# Patient Record
Sex: Male | Born: 1975 | Race: White | Hispanic: No | Smoking: Current every day smoker
Health system: Southern US, Community
[De-identification: ages and names within clinical notes are randomized; demographics above are authoritative.]

## PROBLEM LIST (undated history)

## (undated) DIAGNOSIS — M51369 Other intervertebral disc degeneration, lumbar region without mention of lumbar back pain or lower extremity pain: Secondary | ICD-10-CM

## (undated) DIAGNOSIS — M5136 Other intervertebral disc degeneration, lumbar region: Secondary | ICD-10-CM

## (undated) DIAGNOSIS — G8929 Other chronic pain: Secondary | ICD-10-CM

## (undated) DIAGNOSIS — M5430 Sciatica, unspecified side: Secondary | ICD-10-CM

## (undated) DIAGNOSIS — B1920 Unspecified viral hepatitis C without hepatic coma: Secondary | ICD-10-CM

## (undated) DIAGNOSIS — M549 Dorsalgia, unspecified: Secondary | ICD-10-CM

## (undated) HISTORY — DX: Unspecified viral hepatitis C without hepatic coma: B19.20

## (undated) HISTORY — PX: KNEE SURGERY: SHX244

---

## 2002-08-21 ENCOUNTER — Emergency Department (HOSPITAL_COMMUNITY): Admission: EM | Admit: 2002-08-21 | Discharge: 2002-08-21 | Payer: Self-pay | Admitting: Emergency Medicine

## 2008-02-12 ENCOUNTER — Inpatient Hospital Stay (HOSPITAL_COMMUNITY): Admission: AD | Admit: 2008-02-12 | Discharge: 2008-02-16 | Payer: Self-pay | Admitting: Psychiatry

## 2008-02-12 ENCOUNTER — Ambulatory Visit: Payer: Self-pay | Admitting: Psychiatry

## 2008-02-12 ENCOUNTER — Emergency Department (HOSPITAL_COMMUNITY): Admission: EM | Admit: 2008-02-12 | Discharge: 2008-02-12 | Payer: Self-pay | Admitting: Emergency Medicine

## 2009-10-12 ENCOUNTER — Emergency Department (HOSPITAL_COMMUNITY): Admission: EM | Admit: 2009-10-12 | Discharge: 2009-10-14 | Payer: Self-pay | Admitting: Emergency Medicine

## 2010-11-10 ENCOUNTER — Emergency Department (HOSPITAL_COMMUNITY): Payer: Self-pay

## 2010-11-10 ENCOUNTER — Emergency Department (HOSPITAL_COMMUNITY)
Admission: EM | Admit: 2010-11-10 | Discharge: 2010-11-10 | Disposition: A | Discharge status: Home / Self Care | Payer: Self-pay | Attending: Emergency Medicine | Admitting: Emergency Medicine

## 2010-11-10 DIAGNOSIS — M25473 Effusion, unspecified ankle: Secondary | ICD-10-CM | POA: Insufficient documentation

## 2010-11-10 DIAGNOSIS — X58XXXA Exposure to other specified factors, initial encounter: Secondary | ICD-10-CM | POA: Insufficient documentation

## 2010-11-10 DIAGNOSIS — Y929 Unspecified place or not applicable: Secondary | ICD-10-CM | POA: Insufficient documentation

## 2010-11-10 DIAGNOSIS — M25476 Effusion, unspecified foot: Secondary | ICD-10-CM | POA: Insufficient documentation

## 2010-11-10 DIAGNOSIS — M25579 Pain in unspecified ankle and joints of unspecified foot: Secondary | ICD-10-CM | POA: Insufficient documentation

## 2010-11-10 DIAGNOSIS — S82899A Other fracture of unspecified lower leg, initial encounter for closed fracture: Secondary | ICD-10-CM | POA: Insufficient documentation

## 2010-11-13 LAB — BASIC METABOLIC PANEL
BUN: 7 mg/dL (ref 6–23)
Chloride: 111 mEq/L (ref 96–112)
GFR calc non Af Amer: >60 mL/min (ref >60)
Glucose, Bld: 133 mg/dL — ABNORMAL HIGH (ref 70–99)
Potassium: 3.5 mEq/L (ref 3.5–5.1)
Sodium: 143 mEq/L (ref 135–145)

## 2010-11-13 LAB — RAPID URINE DRUG SCREEN, HOSP PERFORMED
Barbiturates: NOT DETECTED
Benzodiazepines: POSITIVE — AB
Cocaine: POSITIVE — AB
Opiates: NOT DETECTED

## 2010-11-13 LAB — DIFFERENTIAL
Basophils Absolute: 0 10*3/uL (ref 0.0–0.1)
Eosinophils Absolute: 0.1 10*3/uL (ref 0.0–0.7)
Eosinophils Relative: 2 % (ref 0–5)
Lymphocytes Relative: 33 % (ref 12–46)
Lymphs Abs: 2 10*3/uL (ref 0.7–4.0)
Monocytes Absolute: 0.7 10*3/uL (ref 0.1–1.0)

## 2010-11-13 LAB — CBC
HCT: 43.4 % (ref 39.0–52.0)
Hemoglobin: 15 g/dL (ref 13.0–17.0)
MCV: 103.6 fL — ABNORMAL HIGH (ref 78.0–100.0)
RDW: 13.4 % (ref 11.5–15.5)

## 2010-11-13 LAB — URINALYSIS, ROUTINE W REFLEX MICROSCOPIC
Glucose, UA: NEGATIVE mg/dL
Ketones, ur: NEGATIVE mg/dL
Nitrite: NEGATIVE
Specific Gravity, Urine: 1.02 (ref 1.005–1.030)
pH: 5 (ref 5.0–8.0)

## 2010-11-13 LAB — ACETAMINOPHEN LEVEL: Acetaminophen (Tylenol), Serum: 28.8 ug/mL (ref 10–30)

## 2010-11-16 ENCOUNTER — Emergency Department (HOSPITAL_COMMUNITY)
Admission: EM | Admit: 2010-11-16 | Discharge: 2010-11-16 | Disposition: A | Discharge status: Home / Self Care | Payer: Self-pay | Attending: Emergency Medicine | Admitting: Emergency Medicine

## 2010-11-16 ENCOUNTER — Emergency Department (HOSPITAL_COMMUNITY): Payer: Self-pay

## 2010-11-16 DIAGNOSIS — Z09 Encounter for follow-up examination after completed treatment for conditions other than malignant neoplasm: Secondary | ICD-10-CM | POA: Insufficient documentation

## 2010-11-16 DIAGNOSIS — M79609 Pain in unspecified limb: Secondary | ICD-10-CM | POA: Insufficient documentation

## 2010-11-16 DIAGNOSIS — S82409A Unspecified fracture of shaft of unspecified fibula, initial encounter for closed fracture: Secondary | ICD-10-CM | POA: Insufficient documentation

## 2010-11-16 DIAGNOSIS — W19XXXS Unspecified fall, sequela: Secondary | ICD-10-CM | POA: Insufficient documentation

## 2010-11-25 ENCOUNTER — Ambulatory Visit (INDEPENDENT_AMBULATORY_CARE_PROVIDER_SITE_OTHER): Payer: Self-pay | Admitting: Orthopedic Surgery

## 2010-11-25 ENCOUNTER — Encounter: Payer: Self-pay | Admitting: Orthopedic Surgery

## 2010-11-25 VITALS — Ht 64.0 in | Wt 155.0 lb

## 2010-11-25 DIAGNOSIS — S8262XA Displaced fracture of lateral malleolus of left fibula, initial encounter for closed fracture: Secondary | ICD-10-CM

## 2010-11-25 DIAGNOSIS — S8263XA Displaced fracture of lateral malleolus of unspecified fibula, initial encounter for closed fracture: Secondary | ICD-10-CM

## 2010-11-25 MED ORDER — HYDROCODONE-ACETAMINOPHEN 7.5-325 MG PO TABS: 1.0000 | ORAL_TABLET | ORAL | Status: AC | PRN

## 2010-11-25 NOTE — Discharge Summary (Signed)
A separate identifiable. X-ray report, LEFT ankle 3 views. There is a lateral malleolar fracture, minimally displaced with an intact mortise.  Previous x-rays reviewed from the hospital 2 sets  No major change in fracture position.

## 2010-11-25 NOTE — Progress Notes (Addendum)
Chief complaint pain, LEFT ankle.  Date of injury March 17  35 year old male complains of sharp throbbing pain, which is 10 out of 10 and is constant over the LEFT ankle. He has been to the ER on 2 occasions. The 1st time on the 17th and went back to the emergency room on the 21st times had x-rays which show what appears to be a minimally displaced lateral malleolus fracture. He was treated with a posterior splint crutches, and oxycodone 5 mg.  We will repeat his x-ray today to see what the fracture looks like now and make recommendations at that time.  Review of systems he reports unexpected weight loss of nausea and anxiety. Denies blurred vision chest pain, shortness of breath, frequency, skin changes, numbness, easy bleeding and excessive thirst or adverse reaction.  History reviewed. No pertinent family history. History reviewed. No pertinent past medical history. History reviewed. No pertinent past surgical history. Social history patient works in [employer] representative, and maintenance currently unemployed. Does report a history of smoking, and no drug use. No alcohol use. I agree completely with great 12.   General: The patient is normally developed, with normal grooming and hygiene. There are no gross deformities. The body habitus is normal   CDV: The pulse and perfusion of the extremities are normal   LYMPH: There is no gross lymphadenopathy in the extremities   Skin: There are no rashes, ulcers or cafe-au-lait spot   Psyche: The patient is alert, awake and oriented.  Mood is normal   Neuro:  The coordination and balance are normal.  Sensation is normal. Reflexes are 2+ and equal   Musculoskeletal  Left ankle: swelling, lateral malleolar tenderness  Skin ecchymotic  Ankle position is neutral Muscle tone is normal   Short leg nonweightbearing cast applied.

## 2010-12-13 ENCOUNTER — Other Ambulatory Visit: Payer: Self-pay | Admitting: *Deleted

## 2010-12-13 DIAGNOSIS — M25579 Pain in unspecified ankle and joints of unspecified foot: Secondary | ICD-10-CM

## 2010-12-13 MED ORDER — HYDROCODONE-ACETAMINOPHEN 7.5-325 MG PO TABS: 1.0000 | ORAL_TABLET | ORAL | Status: DC | PRN

## 2010-12-26 ENCOUNTER — Other Ambulatory Visit: Payer: Self-pay | Admitting: *Deleted

## 2010-12-26 DIAGNOSIS — M25579 Pain in unspecified ankle and joints of unspecified foot: Secondary | ICD-10-CM

## 2010-12-26 MED ORDER — HYDROCODONE-ACETAMINOPHEN 7.5-325 MG PO TABS: 1.0000 | ORAL_TABLET | ORAL | Status: DC | PRN

## 2011-01-07 ENCOUNTER — Ambulatory Visit: Payer: Self-pay | Admitting: Orthopedic Surgery
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
## 2011-01-10 NOTE — Discharge Summary (Signed)
[name], Jay Keller NO.:  [id number]   MEDICAL RECORD NO.:  [id number]          PATIENT TYPE:  IPS   LOCATION:  0406                          FACILITY:  [facility]   PHYSICIAN:  Anselm Jungling, MD  DATE OF BIRTH:  17-Sep-1975   DATE OF ADMISSION:  02/12/2008  DATE OF DISCHARGE:  02/16/2008                               DISCHARGE SUMMARY   IDENTIFYING DATA AND REASON FOR REFERRAL:  The patient is a 35 year old  married white male admitted for treatment of polysubstance abuse and  depression.  He admitted to drinking heavily and using marijuana heavily  prior to admission.  He had stressors consisting of legal difficulties  and child custody problems.  Please refer to the admission note for  further details pertaining to the symptoms, circumstances and history  that led to his hospitalization.  He was given an initial Axis I  diagnosis of alcohol dependence and polysubstance dependence and  substance-induced mood disorder.   MEDICAL AND LABORATORY:  The patient was medically and physically  assessed by the psychiatric nurse practitioner.  He was in good health  without any active or chronic medical problems.   HOSPITAL COURSE:  The patient was admitted to the adult inpatient  psychiatric service.  He presented as a well-nourished, normally-  developed male who initially spent most of his hospital stay in bed.  He  appeared very tired and depressed.  He was mildly tremulous.  He was  placed on a Librium withdrawal protocol.  There were no signs or  symptoms of psychosis or delirium.   The patient chose to spend most of his hospital stay in bed.  He did  express interest towards the end of his stay in the possibility of a  long-term residential rehabilitation facility, but this did not appear  to be feasible given the patient's funding situation.  He indicated that  he felt ready for discharge on the fifth hospital day.  He indicated  that he would attend 12-step  groups in the community.   He was treated with Seroquel 100 mg every night to stabilize sleep and  Celexa 10 mg daily to address mood-related depressive symptoms.   AFTERCARE:  The patient was to follow-up at [facility] on  February 23, 2008.  He was to follow up with Patty Deal in a follow-up  appointment.   DISCHARGE MEDICATIONS:  1. Seroquel 100 mg every night.  2. Celexa 10 mg daily.   DISCHARGE DIAGNOSES:  AXIS I:  Alcohol dependence, early remission;  polysubstance dependence, early remission;  substance-induced mood  disorder.  AXIS II:  Deferred.  AXIS III:  No acute or chronic illnesses.  AXIS IV:  Stressors severe.  AXIS V: GAF on discharge 50.      Anselm Jungling, MD  Electronically Signed     SPB/MEDQ  D:  02/22/2008  T:  02/22/2008  Job:  [id number]

## 2011-01-16 ENCOUNTER — Ambulatory Visit (INDEPENDENT_AMBULATORY_CARE_PROVIDER_SITE_OTHER): Payer: Self-pay | Admitting: Orthopedic Surgery

## 2011-01-16 DIAGNOSIS — M25579 Pain in unspecified ankle and joints of unspecified foot: Secondary | ICD-10-CM

## 2011-01-16 DIAGNOSIS — S82899A Other fracture of unspecified lower leg, initial encounter for closed fracture: Secondary | ICD-10-CM

## 2011-01-16 MED ORDER — HYDROCODONE-ACETAMINOPHEN 5-325 MG PO TABS: 1.0000 | ORAL_TABLET | ORAL | Status: DC | PRN

## 2011-01-16 NOTE — Progress Notes (Signed)
Fracture care.  Followup.  Lateral malleolus fracture, treated with cast, nonweightbearing.  X-rays today.  Complains of lateral pain.  X-rays show spiral Weber B. Type fibular fracture, nondisplaced.  Removed cast still tenderness at fracture.  Aircast  Weight-bear as tolerated. X-rays 6 weeks.  Medicine changed to hydrocodone 5 mg

## 2011-01-16 NOTE — Patient Instructions (Signed)
Wear with reg shoes   Weight beara s tolerated   Wean off the crutches   X-rays in 6 weeks

## 2011-02-13 ENCOUNTER — Other Ambulatory Visit: Payer: Self-pay | Admitting: *Deleted

## 2011-02-13 DIAGNOSIS — M25579 Pain in unspecified ankle and joints of unspecified foot: Secondary | ICD-10-CM

## 2011-02-13 MED ORDER — HYDROCODONE-ACETAMINOPHEN 5-325 MG PO TABS: 1.0000 | ORAL_TABLET | ORAL | Status: DC | PRN

## 2011-03-05 ENCOUNTER — Ambulatory Visit: Payer: Self-pay | Admitting: Orthopedic Surgery

## 2011-03-06 ENCOUNTER — Other Ambulatory Visit: Payer: Self-pay | Admitting: *Deleted

## 2011-03-06 DIAGNOSIS — M25579 Pain in unspecified ankle and joints of unspecified foot: Secondary | ICD-10-CM

## 2011-03-06 MED ORDER — HYDROCODONE-ACETAMINOPHEN 5-325 MG PO TABS: 1.0000 | ORAL_TABLET | ORAL | Status: DC | PRN

## 2011-03-12 ENCOUNTER — Ambulatory Visit (INDEPENDENT_AMBULATORY_CARE_PROVIDER_SITE_OTHER): Payer: Self-pay | Admitting: Orthopedic Surgery

## 2011-03-12 ENCOUNTER — Encounter: Payer: Self-pay | Admitting: Orthopedic Surgery

## 2011-03-12 DIAGNOSIS — S8263XA Displaced fracture of lateral malleolus of unspecified fibula, initial encounter for closed fracture: Secondary | ICD-10-CM

## 2011-03-12 DIAGNOSIS — M25579 Pain in unspecified ankle and joints of unspecified foot: Secondary | ICD-10-CM

## 2011-03-12 MED ORDER — HYDROCODONE-ACETAMINOPHEN 5-325 MG PO TABS: 1.0000 | ORAL_TABLET | ORAL | Status: DC | PRN

## 2011-03-12 NOTE — Progress Notes (Signed)
  Status post closed LEFT ankle fracture lateral malleolus treated with cast  It's been approximately 4 months he comes in complaining of lateral pain initial treatment was with cast followed by brace is currently in an Aircast  He seems to have activity related pain lateral malleolus  Review of systems negative for catching locking giving way  Exam GENERAL: normal development   CDV: pulses are normal   Skin: normal  Lymph: deferred  Psychiatric: awake, alert and oriented  Neuro: normal sensation  MSK  He has normal range of motion LEFT ankle with tenderness over the lateral malleolus.  He does have some pain with rotational stress.  The x-ray shows that the fracture is fibrous union alignment normal mortise intact  Recommend to treatment options to him one was surgical with plate fixation vs. Continued nonoperative treatment and brace wear medication for pain  I advised him to try to wait this out this should eventually heal on its own.  He agreed.  Follow up in 2 months take hydrocodone for pain  Continue brace

## 2011-03-12 NOTE — Progress Notes (Signed)
Separately identifiable x-ray report 3 views LEFT ankle There is a lateral malleolus fracture spiral, with what appears to be fibrous union.  Compared to previous film shows stable configuration.  Mortise intact.  Impression stable lateral malleolus fracture with fibrous union

## 2011-04-08 ENCOUNTER — Other Ambulatory Visit: Payer: Self-pay | Admitting: *Deleted

## 2011-04-08 DIAGNOSIS — M25579 Pain in unspecified ankle and joints of unspecified foot: Secondary | ICD-10-CM

## 2011-04-08 MED ORDER — HYDROCODONE-ACETAMINOPHEN 5-325 MG PO TABS: 1.0000 | ORAL_TABLET | ORAL | Status: DC | PRN

## 2011-05-14 ENCOUNTER — Ambulatory Visit (INDEPENDENT_AMBULATORY_CARE_PROVIDER_SITE_OTHER): Payer: Self-pay | Admitting: Orthopedic Surgery

## 2011-05-14 ENCOUNTER — Ambulatory Visit (HOSPITAL_COMMUNITY)
Admission: RE | Admit: 2011-05-14 | Discharge: 2011-05-14 | Disposition: A | Discharge status: Home / Self Care | Payer: Self-pay | Source: Ambulatory Visit | Attending: Orthopedic Surgery | Admitting: Orthopedic Surgery

## 2011-05-14 ENCOUNTER — Other Ambulatory Visit: Payer: Self-pay | Admitting: Orthopedic Surgery

## 2011-05-14 ENCOUNTER — Encounter: Payer: Self-pay | Admitting: Orthopedic Surgery

## 2011-05-14 VITALS — Ht 64.0 in | Wt 155.0 lb

## 2011-05-14 DIAGNOSIS — M25579 Pain in unspecified ankle and joints of unspecified foot: Secondary | ICD-10-CM

## 2011-05-14 DIAGNOSIS — Z4789 Encounter for other orthopedic aftercare: Secondary | ICD-10-CM | POA: Insufficient documentation

## 2011-05-14 DIAGNOSIS — S8262XA Displaced fracture of lateral malleolus of left fibula, initial encounter for closed fracture: Secondary | ICD-10-CM

## 2011-05-14 MED ORDER — HYDROCODONE-ACETAMINOPHEN 5-325 MG PO TABS: 1.0000 | ORAL_TABLET | ORAL | Status: DC | PRN

## 2011-05-14 NOTE — Patient Instructions (Signed)
Wear air cast to work

## 2011-05-14 NOTE — Progress Notes (Signed)
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

## 2011-05-22 LAB — CBC
HCT: 48.4
Hemoglobin: 16.8
MCHC: 34.8
MCV: 102.6 — ABNORMAL HIGH
RBC: 4.72
WBC: 6.4

## 2011-05-22 LAB — BASIC METABOLIC PANEL
CO2: 27
Chloride: 97
GFR calc Af Amer: >60
Potassium: 4

## 2011-05-22 LAB — RAPID URINE DRUG SCREEN, HOSP PERFORMED
Cocaine: NOT DETECTED
Opiates: NOT DETECTED

## 2011-05-22 LAB — DIFFERENTIAL
Eosinophils Absolute: 0
Eosinophils Relative: 0
Lymphs Abs: 0.7
Monocytes Absolute: 1
Monocytes Relative: 16 — ABNORMAL HIGH

## 2011-05-22 LAB — HEPATIC FUNCTION PANEL
ALT: 277 — ABNORMAL HIGH
AST: 196 — ABNORMAL HIGH
Alkaline Phosphatase: 107
Indirect Bilirubin: 0.9
Total Protein: 7.4

## 2011-05-22 LAB — ETHANOL: Alcohol, Ethyl (B): <5

## 2011-07-14 ENCOUNTER — Other Ambulatory Visit: Payer: Self-pay | Admitting: *Deleted

## 2011-07-14 DIAGNOSIS — M25579 Pain in unspecified ankle and joints of unspecified foot: Secondary | ICD-10-CM

## 2011-07-14 MED ORDER — HYDROCODONE-ACETAMINOPHEN 5-325 MG PO TABS: 1.0000 | ORAL_TABLET | ORAL | Status: DC | PRN

## 2011-08-13 ENCOUNTER — Ambulatory Visit: Payer: Self-pay | Admitting: Orthopedic Surgery

## 2011-08-13 ENCOUNTER — Encounter: Payer: Self-pay | Admitting: Orthopedic Surgery

## 2012-04-02 ENCOUNTER — Emergency Department (HOSPITAL_COMMUNITY)
Admission: EM | Admit: 2012-04-02 | Discharge: 2012-04-02 | Disposition: A | Discharge status: Home / Self Care | Payer: Self-pay | Attending: Emergency Medicine | Admitting: Emergency Medicine

## 2012-04-02 ENCOUNTER — Encounter (HOSPITAL_COMMUNITY): Payer: Self-pay | Admitting: *Deleted

## 2012-04-02 DIAGNOSIS — L255 Unspecified contact dermatitis due to plants, except food: Secondary | ICD-10-CM | POA: Insufficient documentation

## 2012-04-02 DIAGNOSIS — F17200 Nicotine dependence, unspecified, uncomplicated: Secondary | ICD-10-CM | POA: Insufficient documentation

## 2012-04-02 MED ORDER — PREDNISONE 10 MG PO TABS
60.0000 mg | ORAL_TABLET | Freq: Once | ORAL | Status: AC
  Administered 2012-04-02: 60 mg via ORAL
  Filled 2012-04-02: qty 6

## 2012-04-02 MED ORDER — PREDNISONE 50 MG PO TABS: ORAL_TABLET | ORAL | Status: AC

## 2012-04-02 MED ORDER — FAMOTIDINE 20 MG PO TABS
20.0000 mg | ORAL_TABLET | Freq: Once | ORAL | Status: AC
  Administered 2012-04-02: 20 mg via ORAL
  Filled 2012-04-02: qty 1

## 2012-04-02 MED ORDER — DIPHENHYDRAMINE HCL 25 MG PO CAPS
50.0000 mg | ORAL_CAPSULE | Freq: Four times a day (QID) | ORAL | Status: DC | PRN
  Administered 2012-04-02: 50 mg via ORAL
  Filled 2012-04-02: qty 2

## 2012-04-02 NOTE — ED Provider Notes (Signed)
History     CSN: [id number]  Arrival date & time 04/02/12  1113   First MD Initiated Contact with Patient 04/02/12 1151      Chief Complaint  Patient presents with  . Rash    (Consider location/radiation/quality/duration/timing/severity/associated sxs/prior treatment) HPI Comments: states he was weed-eating on Monday and "got into some poison ivy".  Itching all over.  Patient is a 36 y.o. male presenting with rash. The history is provided by the patient. No language interpreter was used.  Rash  This is a new problem. Episode onset: 4 days ago. The problem has been gradually worsening. The problem is associated with plant contact. There has been no fever. The pain is moderate. Associated symptoms include blisters, itching and weeping. He has tried nothing for the symptoms.    History reviewed. No pertinent past medical history.  History reviewed. No pertinent past surgical history.  History reviewed. No pertinent family history.  History  Substance Use Topics  . Smoking status: Current Everyday Smoker  . Smokeless tobacco: Not on file  . Alcohol Use: Yes     rarely      Review of Systems  Constitutional: Negative for fever and chills.  Skin: Positive for itching and rash.  All other systems reviewed and are negative.    Allergies  Review of patient's allergies indicates no known allergies.  Home Medications   Current Outpatient Rx  Name Route Sig Dispense Refill  . HYDROCODONE-ACETAMINOPHEN 5-325 MG PO TABS Oral Take 1 tablet by mouth every 4 (four) hours as needed for pain. 60 tablet 0  . IBUPROFEN 200 MG PO TABS Oral Take 400 mg by mouth every 6 (six) hours as needed.      Marland Kitchen PREDNISONE 50 MG PO TABS  One tab po QD 6 tablet 0    BP 134/82  Pulse 94  Temp 98.9 F (37.2 C) (Oral)  Resp 18  Ht 5\' 3"  (1.6 m)  Wt 145 lb (65.772 kg)  BMI 25.69 kg/m2  SpO2 98%  Physical Exam  Nursing note and vitals reviewed. Constitutional: He is oriented to person,  place, and time. He appears well-developed and well-nourished.  HENT:  Head: Normocephalic and atraumatic.  Eyes: EOM are normal.  Neck: Normal range of motion.  Cardiovascular: Normal rate, regular rhythm, normal heart sounds and intact distal pulses.   Pulmonary/Chest: Effort normal and breath sounds normal. No respiratory distress.  Abdominal: Soft. He exhibits no distension. There is no tenderness.  Musculoskeletal: Normal range of motion.  Neurological: He is alert and oriented to person, place, and time.  Skin: Skin is warm and dry. Rash noted.       Widely scattered redness, itching and blisters to face, neck,torso arms, groin and thighs.  Psychiatric: He has a normal mood and affect. Judgment normal.    ED Course  Procedures (including critical care time)  Labs Reviewed - No data to display No results found.   1. Rhus dermatitis       MDM  rx-prednisone 50 mg, 6 Benadryl 50 mg QID pepcid 20 mg BID F/u with PCP prn        Evalina Field, PA 04/05/12 1532

## 2012-04-02 NOTE — ED Notes (Signed)
Pt with rash since weed eating on Monday, possible poison oak

## 2012-04-02 NOTE — ED Notes (Signed)
R. Miller, PA at bedside  

## 2012-04-02 NOTE — ED Notes (Signed)
Itching red rash, onset Monday after weed -eating.

## 2012-04-03 NOTE — ED Provider Notes (Signed)
History     CSN: [id number]  Arrival date & time 04/02/12  1113   First MD Initiated Contact with Patient 04/02/12 1151      Chief Complaint  Patient presents with  . Rash    (Consider location/radiation/quality/duration/timing/severity/associated sxs/prior treatment) HPI  History reviewed. No pertinent past medical history.  History reviewed. No pertinent past surgical history.  History reviewed. No pertinent family history.  History  Substance Use Topics  . Smoking status: Current Everyday Smoker  . Smokeless tobacco: Not on file  . Alcohol Use: Yes     rarely      Review of Systems  Allergies  Review of patient's allergies indicates no known allergies.  Home Medications   Current Outpatient Rx  Name Route Sig Dispense Refill  . HYDROCODONE-ACETAMINOPHEN 5-325 MG PO TABS Oral Take 1 tablet by mouth every 4 (four) hours as needed for pain. 60 tablet 0  . IBUPROFEN 200 MG PO TABS Oral Take 400 mg by mouth every 6 (six) hours as needed.      Marland Kitchen PREDNISONE 50 MG PO TABS  One tab po QD 6 tablet 0    BP 134/82  Pulse 94  Temp 98.9 F (37.2 C) (Oral)  Resp 18  Ht 5\' 3"  (1.6 m)  Wt 145 lb (65.772 kg)  BMI 25.69 kg/m2  SpO2 98%  Physical Exam  ED Course  Procedures (including critical care time)  Labs Reviewed - No data to display No results found.   1. Rhus dermatitis       MDM  Medical screening examination/treatment/procedure(s) were performed by non-physician practitioner and as supervising physician I was immediately available for consultation/collaboration.        Donnetta Hutching, MD 04/03/12 [phone]

## 2012-04-12 NOTE — ED Provider Notes (Signed)
Medical screening examination/treatment/procedure(s) were performed by non-physician practitioner and as supervising physician I was immediately available for consultation/collaboration.  Karuna Balducci, MD 04/12/12 1944 

## 2013-01-17 ENCOUNTER — Encounter (HOSPITAL_COMMUNITY): Payer: Self-pay

## 2013-01-17 ENCOUNTER — Emergency Department (HOSPITAL_COMMUNITY): Payer: Self-pay

## 2013-01-17 ENCOUNTER — Emergency Department (HOSPITAL_COMMUNITY)
Admission: EM | Admit: 2013-01-17 | Discharge: 2013-01-17 | Disposition: A | Discharge status: Home / Self Care | Payer: Self-pay | Attending: Emergency Medicine | Admitting: Emergency Medicine

## 2013-01-17 DIAGNOSIS — S335XXA Sprain of ligaments of lumbar spine, initial encounter: Secondary | ICD-10-CM | POA: Insufficient documentation

## 2013-01-17 DIAGNOSIS — T148XXA Other injury of unspecified body region, initial encounter: Secondary | ICD-10-CM

## 2013-01-17 DIAGNOSIS — M545 Low back pain: Secondary | ICD-10-CM

## 2013-01-17 DIAGNOSIS — F17200 Nicotine dependence, unspecified, uncomplicated: Secondary | ICD-10-CM | POA: Insufficient documentation

## 2013-01-17 DIAGNOSIS — Y9289 Other specified places as the place of occurrence of the external cause: Secondary | ICD-10-CM | POA: Insufficient documentation

## 2013-01-17 DIAGNOSIS — Y9319 Activity, other involving water and watercraft: Secondary | ICD-10-CM | POA: Insufficient documentation

## 2013-01-17 DIAGNOSIS — W1692XA Jumping or diving into unspecified water causing other injury, initial encounter: Secondary | ICD-10-CM | POA: Insufficient documentation

## 2013-01-17 HISTORY — DX: Other intervertebral disc degeneration, lumbar region without mention of lumbar back pain or lower extremity pain: M51.369

## 2013-01-17 HISTORY — DX: Other intervertebral disc degeneration, lumbar region: M51.36

## 2013-01-17 MED ORDER — HYDROMORPHONE HCL PF 1 MG/ML IJ SOLN
1.0000 mg | Freq: Once | INTRAMUSCULAR | Status: AC
  Administered 2013-01-17: 1 mg via INTRAVENOUS
  Filled 2013-01-17: qty 1

## 2013-01-17 MED ORDER — FENTANYL CITRATE 0.05 MG/ML IJ SOLN
50.0000 ug | Freq: Once | INTRAMUSCULAR | Status: AC
  Administered 2013-01-17: 50 ug via INTRAVENOUS

## 2013-01-17 MED ORDER — DIAZEPAM 5 MG PO TABS
5.0000 mg | ORAL_TABLET | Freq: Once | ORAL | Status: AC
  Administered 2013-01-17: 5 mg via ORAL
  Filled 2013-01-17: qty 1

## 2013-01-17 MED ORDER — FENTANYL CITRATE 0.05 MG/ML IJ SOLN
INTRAMUSCULAR | Status: AC
  Administered 2013-01-17: 50 ug via INTRAVENOUS
  Filled 2013-01-17: qty 2

## 2013-01-17 MED ORDER — KETOROLAC TROMETHAMINE 30 MG/ML IJ SOLN
30.0000 mg | Freq: Once | INTRAMUSCULAR | Status: AC
  Administered 2013-01-17: 30 mg via INTRAVENOUS
  Filled 2013-01-17: qty 1

## 2013-01-17 MED ORDER — OXYCODONE-ACETAMINOPHEN 5-325 MG PO TABS: ORAL_TABLET | ORAL | Status: DC

## 2013-01-17 MED ORDER — HYDROMORPHONE HCL PF 1 MG/ML IJ SOLN
1.0000 mg | Freq: Once | INTRAMUSCULAR | Status: AC
  Administered 2013-01-17: 1 mg via INTRAVENOUS

## 2013-01-17 MED ORDER — DIAZEPAM 5 MG PO TABS: 5.0000 mg | ORAL_TABLET | Freq: Two times a day (BID) | ORAL | Status: DC | PRN

## 2013-01-17 MED ORDER — HYDROMORPHONE HCL PF 1 MG/ML IJ SOLN
1.0000 mg | Freq: Once | INTRAMUSCULAR | Status: DC
  Filled 2013-01-17: qty 1

## 2013-01-17 MED ORDER — NAPROXEN 250 MG PO TABS: 250.0000 mg | ORAL_TABLET | Freq: Two times a day (BID) | ORAL | Status: DC

## 2013-01-17 NOTE — ED Notes (Signed)
Left in c/o girlfriend for transport home; instructions, prescriptions and f/u information given/reviewed - verbalizes understanding.
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
## 2013-01-17 NOTE — ED Notes (Signed)
Pt reports injuring his back 2 days ago after jumping off the "[place]" [place] the [place].  Pt reports increased pain over the past two days but became unbearable today.  Pt reports pain with breathing and difficulty walking.  Pt had to be assisted out of his vehicle.

## 2015-08-26 DIAGNOSIS — B182 Chronic viral hepatitis C: Secondary | ICD-10-CM | POA: Insufficient documentation

## 2015-12-10 ENCOUNTER — Emergency Department (HOSPITAL_COMMUNITY)
Admission: EM | Admit: 2015-12-10 | Discharge: 2015-12-11 | Disposition: A | Discharge status: Other Acute Inpatient Hospital | Payer: Self-pay | Attending: Emergency Medicine | Admitting: Emergency Medicine

## 2015-12-10 ENCOUNTER — Encounter (HOSPITAL_COMMUNITY): Payer: Self-pay | Admitting: Emergency Medicine

## 2015-12-10 DIAGNOSIS — F17200 Nicotine dependence, unspecified, uncomplicated: Secondary | ICD-10-CM | POA: Insufficient documentation

## 2015-12-10 DIAGNOSIS — F1910 Other psychoactive substance abuse, uncomplicated: Secondary | ICD-10-CM

## 2015-12-10 DIAGNOSIS — F1020 Alcohol dependence, uncomplicated: Secondary | ICD-10-CM

## 2015-12-10 DIAGNOSIS — F10921 Alcohol use, unspecified with intoxication delirium: Secondary | ICD-10-CM | POA: Insufficient documentation

## 2015-12-10 LAB — CBC WITH DIFFERENTIAL/PLATELET
BASOS ABS: 0.1 10*3/uL (ref 0.0–0.1)
BASOS PCT: 1 %
EOS ABS: 0 10*3/uL (ref 0.0–0.7)
EOS PCT: 0 %
HCT: 47 % (ref 39.0–52.0)
Hemoglobin: 16.3 g/dL (ref 13.0–17.0)
Lymphocytes Relative: 30 %
Lymphs Abs: 2.7 10*3/uL (ref 0.7–4.0)
MCH: 34.6 pg — ABNORMAL HIGH (ref 26.0–34.0)
MCHC: 34.7 g/dL (ref 30.0–36.0)
MCV: 99.8 fL (ref 78.0–100.0)
MONO ABS: 0.6 10*3/uL (ref 0.1–1.0)
Monocytes Relative: 7 %
Neutro Abs: 5.5 10*3/uL (ref 1.7–7.7)
Neutrophils Relative %: 62 %
PLATELETS: 412 10*3/uL — AB (ref 150–400)
RBC: 4.71 MIL/uL (ref 4.22–5.81)
RDW: 14.3 % (ref 11.5–15.5)
WBC: 8.9 10*3/uL (ref 4.0–10.5)

## 2015-12-10 LAB — COMPREHENSIVE METABOLIC PANEL
ALBUMIN: 4.9 g/dL (ref 3.5–5.0)
ALT: 171 U/L — AB (ref 17–63)
AST: 203 U/L — AB (ref 15–41)
Alkaline Phosphatase: 133 U/L — ABNORMAL HIGH (ref 38–126)
Anion gap: 21 — ABNORMAL HIGH (ref 5–15)
BUN: 15 mg/dL (ref 6–20)
CHLORIDE: 99 mmol/L — AB (ref 101–111)
CO2: 20 mmol/L — AB (ref 22–32)
CREATININE: 0.98 mg/dL (ref 0.61–1.24)
Calcium: 8.3 mg/dL — ABNORMAL LOW (ref 8.9–10.3)
GFR calc Af Amer: >60 mL/min (ref >60)
Glucose, Bld: 74 mg/dL (ref 65–99)
POTASSIUM: 3.9 mmol/L (ref 3.5–5.1)
SODIUM: 140 mmol/L (ref 135–145)
Total Bilirubin: 1.1 mg/dL (ref 0.3–1.2)
Total Protein: 8.3 g/dL — ABNORMAL HIGH (ref 6.5–8.1)

## 2015-12-10 LAB — RAPID URINE DRUG SCREEN, HOSP PERFORMED
AMPHETAMINES: POSITIVE — AB
BARBITURATES: NOT DETECTED
BENZODIAZEPINES: NOT DETECTED
Cocaine: NOT DETECTED
Opiates: NOT DETECTED
Tetrahydrocannabinol: POSITIVE — AB

## 2015-12-10 LAB — ETHANOL: ALCOHOL ETHYL (B): 460 mg/dL — AB (ref <5)

## 2015-12-10 NOTE — ED Notes (Signed)
Pt given water at this time 

## 2015-12-10 NOTE — ED Provider Notes (Signed)
CSN: [id number]     Arrival date & time 12/10/15  1835 History   First MD Initiated Contact with Patient 12/10/15 1855     Chief Complaint  Patient presents with  . V70.1     Level V caveat due to intoxication/psychiatric disorder. The history is provided by the patient.   patient was brought in under involuntary commitment. Patient is not able to provide history and history is been obtained from police and IVC paperwork. Reportedly has been drinking heavily. May be hallucinating. Is reportedly attempting to drink himself to death. Reportedly has been saying things against my nor disease. Reportedly has destroyed the hotel room but he has been living in. Patient will not provide history to me.  Past Medical History  Diagnosis Date  . DDD (degenerative disc disease), lumbar    History reviewed. No pertinent past surgical history. History reviewed. No pertinent family history. Social History  Substance Use Topics  . Smoking status: Current Every Day Smoker  . Smokeless tobacco: None  . Alcohol Use: Yes     Comment: rarely    Review of Systems  Unable to perform ROS: Psychiatric disorder      Allergies  Review of patient's allergies indicates no known allergies.  Home Medications   Prior to Admission medications   Medication Sig Start Date End Date Taking? Authorizing Provider  diazepam (VALIUM) 5 MG tablet Take 1 tablet (5 mg total) by mouth 2 (two) times daily as needed (muscle spasm). 01/17/13   Samuel Jester, DO  ibuprofen (ADVIL,MOTRIN) 200 MG tablet Take 800 mg by mouth every 6 (six) hours as needed for pain.     Historical Provider, MD  naproxen (NAPROSYN) 250 MG tablet Take 1 tablet (250 mg total) by mouth 2 (two) times daily with a meal. 01/17/13   Samuel Jester, DO  oxyCODONE-acetaminophen (PERCOCET/ROXICET) 5-325 MG per tablet 1 or 2 tabs PO q6h prn pain 01/17/13   Samuel Jester, DO   BP 149/95 mmHg  Pulse 110  Temp(Src) 98.9 F (37.2 C) (Oral)  Resp 20   Ht  (1.6 m)  Wt 160 lb (72.576 kg)  BMI 28.35 kg/m2  SpO2 95% Physical Exam  Constitutional: He appears well-developed.  Patient is mildly agitated and will not allow me to get close enough to listen to his heart or lungs.  HENT:  Head: Atraumatic.  Eyes:  Pupils appear reactive.  Neck: No JVD present.  Pulmonary/Chest: No respiratory distress.  Musculoskeletal:  Patient appears in moving all extremities and right foot is handcuffed to the bed.  Neurological: He is alert.  Psychiatric:  Patient appears somewhat agitated    ED Course  Procedures (including critical care time) Labs Review Labs Reviewed  URINE RAPID DRUG SCREEN, HOSP PERFORMED - Abnormal; Notable for the following:    Amphetamines POSITIVE (*)    Tetrahydrocannabinol POSITIVE (*)    All other components within normal limits  ETHANOL - Abnormal; Notable for the following:    Alcohol, Ethyl (B) 460 (*)    All other components within normal limits  CBC WITH DIFFERENTIAL/PLATELET - Abnormal; Notable for the following:    MCH 34.6 (*)    Platelets 412 (*)    All other components within normal limits  COMPREHENSIVE METABOLIC PANEL - Abnormal; Notable for the following:    Chloride 99 (*)    CO2 20 (*)    Calcium 8.3 (*)    Total Protein 8.3 (*)    AST 203 (*)  ALT 171 (*)    Alkaline Phosphatase 133 (*)    Anion gap 21 (*)    All other components within normal limits    Imaging Review No results found. I have personally reviewed and evaluated these images and lab results as part of my medical decision-making.   EKG Interpretation None      MDM   Final diagnoses:  Alcohol intoxication, with delirium (HCC)  Substance abuse    Patient brought in under involuntary commitment. Somewhat belligerent. Do not allow close examination of him. Patient has mildly elevated transaminases likely from the alcohol. Alcohol level is 490 and will need monitoring after this decreases. Will likely need CIWA  protocol to help stop severe withdrawal. TTS evaluation after more sobriety.    Benjiman CoreNathan Chantia Amalfitano, MD 12/10/15 2130

## 2015-12-10 NOTE — ED Notes (Signed)
IVC papers taken out by mother and aunt.  Pt has been drinking about one gallon of liquor per day.  Says pt is trying to drink himself to death.  Has drink very much in the last 7-8 weeks.  Possibly hearing voices. Pt is not sleeping at night, has been up for the past 5 days.  He wakes up drinking and pt is not eating.  He is cursing at people that are not there.  Has been living in a hotel and tearing up the room.  Pt is a danger to self and to others.  History of ETOH abuse.

## 2015-12-10 NOTE — ED Notes (Signed)
CRITICAL VALUE ALERT  Critical value received:  etoh 460  Date of notification:  12/10/15  Time of notification:  2025  Critical value read back:Yes.    Nurse who received alert:  Bronson CurbKristy Jaylei Fuerte, RN  MD notified (1st page):  Dr Rubin PayorPickering  Time of first page:  2025  MD notified (2nd page):  Time of second page:  Responding MD:  Dr Rubin PayorPickering  Time MD responded:  2025

## 2015-12-11 ENCOUNTER — Encounter (HOSPITAL_COMMUNITY): Payer: Self-pay

## 2015-12-11 ENCOUNTER — Observation Stay (HOSPITAL_COMMUNITY)
Admission: AD | Admit: 2015-12-11 | Discharge: 2015-12-12 | Disposition: A | Discharge status: Home / Self Care | Payer: Self-pay | Source: Intra-hospital | Attending: Psychiatry | Admitting: Psychiatry

## 2015-12-11 DIAGNOSIS — F1020 Alcohol dependence, uncomplicated: Principal | ICD-10-CM | POA: Diagnosis present

## 2015-12-11 DIAGNOSIS — F411 Generalized anxiety disorder: Secondary | ICD-10-CM | POA: Diagnosis present

## 2015-12-11 DIAGNOSIS — F17200 Nicotine dependence, unspecified, uncomplicated: Secondary | ICD-10-CM | POA: Insufficient documentation

## 2015-12-11 DIAGNOSIS — M5136 Other intervertebral disc degeneration, lumbar region: Secondary | ICD-10-CM | POA: Insufficient documentation

## 2015-12-11 DIAGNOSIS — Y908 Blood alcohol level of 240 mg/100 ml or more: Secondary | ICD-10-CM | POA: Insufficient documentation

## 2015-12-11 LAB — BASIC METABOLIC PANEL
ANION GAP: 15 (ref 5–15)
BUN: 11 mg/dL (ref 6–20)
CO2: 27 mmol/L (ref 22–32)
Calcium: 8.1 mg/dL — ABNORMAL LOW (ref 8.9–10.3)
Chloride: 96 mmol/L — ABNORMAL LOW (ref 101–111)
Creatinine, Ser: 0.7 mg/dL (ref 0.61–1.24)
Glucose, Bld: 139 mg/dL — ABNORMAL HIGH (ref 65–99)
POTASSIUM: 3.5 mmol/L (ref 3.5–5.1)
SODIUM: 138 mmol/L (ref 135–145)

## 2015-12-11 LAB — ETHANOL: ALCOHOL ETHYL (B): 20 mg/dL — AB (ref <5)

## 2015-12-11 MED ORDER — VITAMIN B-1 100 MG PO TABS
100.0000 mg | ORAL_TABLET | Freq: Every day | ORAL | Status: DC
  Administered 2015-12-12: 100 mg via ORAL
  Filled 2015-12-11: qty 1

## 2015-12-11 MED ORDER — ADULT MULTIVITAMIN W/MINERALS CH
1.0000 | ORAL_TABLET | Freq: Every day | ORAL | Status: DC
  Administered 2015-12-12: 1 via ORAL
  Filled 2015-12-11: qty 1

## 2015-12-11 MED ORDER — ACETAMINOPHEN 325 MG PO TABS
650.0000 mg | ORAL_TABLET | ORAL | Status: DC | PRN
  Administered 2015-12-11: 650 mg via ORAL
  Filled 2015-12-11: qty 2

## 2015-12-11 MED ORDER — LORAZEPAM 1 MG PO TABS
0.0000 mg | ORAL_TABLET | Freq: Four times a day (QID) | ORAL | Status: DC
  Administered 2015-12-11 (×2): 1 mg via ORAL
  Filled 2015-12-11 (×2): qty 1

## 2015-12-11 MED ORDER — VITAMIN B-1 100 MG PO TABS
100.0000 mg | ORAL_TABLET | Freq: Every day | ORAL | Status: DC
  Administered 2015-12-11: 100 mg via ORAL
  Filled 2015-12-11: qty 1

## 2015-12-11 MED ORDER — HYDROXYZINE HCL 25 MG PO TABS
25.0000 mg | ORAL_TABLET | Freq: Four times a day (QID) | ORAL | Status: DC | PRN
  Administered 2015-12-11 – 2015-12-12 (×2): 25 mg via ORAL
  Filled 2015-12-11 (×2): qty 1

## 2015-12-11 MED ORDER — ONDANSETRON HCL 4 MG PO TABS
4.0000 mg | ORAL_TABLET | Freq: Three times a day (TID) | ORAL | Status: DC | PRN
  Administered 2015-12-11 (×2): 4 mg via ORAL
  Filled 2015-12-11 (×2): qty 1

## 2015-12-11 MED ORDER — MAGNESIUM HYDROXIDE 400 MG/5ML PO SUSP: 30.0000 mL | Freq: Every day | ORAL | Status: DC | PRN

## 2015-12-11 MED ORDER — ONDANSETRON 4 MG PO TBDP
4.0000 mg | ORAL_TABLET | Freq: Four times a day (QID) | ORAL | Status: DC | PRN
  Administered 2015-12-11 – 2015-12-12 (×2): 4 mg via ORAL
  Filled 2015-12-11 (×2): qty 1

## 2015-12-11 MED ORDER — THIAMINE HCL 100 MG/ML IJ SOLN: 100.0000 mg | Freq: Every day | INTRAMUSCULAR | Status: DC

## 2015-12-11 MED ORDER — ALUM & MAG HYDROXIDE-SIMETH 200-200-20 MG/5ML PO SUSP
30.0000 mL | ORAL | Status: DC | PRN
  Administered 2015-12-11: 30 mL via ORAL
  Filled 2015-12-11: qty 30

## 2015-12-11 MED ORDER — LORAZEPAM 1 MG PO TABS: 1.0000 mg | ORAL_TABLET | Freq: Every day | ORAL | Status: DC

## 2015-12-11 MED ORDER — LORAZEPAM 1 MG PO TABS
1.0000 mg | ORAL_TABLET | Freq: Three times a day (TID) | ORAL | Status: DC
  Administered 2015-12-12 (×2): 1 mg via ORAL
  Filled 2015-12-11 (×2): qty 1

## 2015-12-11 MED ORDER — NICOTINE 21 MG/24HR TD PT24: 21.0000 mg | MEDICATED_PATCH | Freq: Every day | TRANSDERMAL | Status: DC

## 2015-12-11 MED ORDER — LOPERAMIDE HCL 2 MG PO CAPS: 2.0000 mg | ORAL_CAPSULE | ORAL | Status: DC | PRN

## 2015-12-11 MED ORDER — TRAZODONE HCL 100 MG PO TABS
100.0000 mg | ORAL_TABLET | Freq: Every evening | ORAL | Status: DC | PRN
  Administered 2015-12-12: 100 mg via ORAL
  Filled 2015-12-11: qty 1

## 2015-12-11 MED ORDER — LORAZEPAM 1 MG PO TABS: 0.0000 mg | ORAL_TABLET | Freq: Two times a day (BID) | ORAL | Status: DC

## 2015-12-11 MED ORDER — LORAZEPAM 1 MG PO TABS
1.0000 mg | ORAL_TABLET | Freq: Four times a day (QID) | ORAL | Status: DC | PRN
  Administered 2015-12-12 (×2): 1 mg via ORAL
  Filled 2015-12-11 (×2): qty 1

## 2015-12-11 MED ORDER — ACETAMINOPHEN 325 MG PO TABS: 650.0000 mg | ORAL_TABLET | Freq: Four times a day (QID) | ORAL | Status: DC | PRN

## 2015-12-11 MED ORDER — NICOTINE 21 MG/24HR TD PT24
21.0000 mg | MEDICATED_PATCH | Freq: Every day | TRANSDERMAL | Status: DC
  Administered 2015-12-11 – 2015-12-12 (×2): 21 mg via TRANSDERMAL
  Filled 2015-12-11 (×2): qty 1

## 2015-12-11 MED ORDER — ZOLPIDEM TARTRATE 5 MG PO TABS
10.0000 mg | ORAL_TABLET | Freq: Every evening | ORAL | Status: DC | PRN
Start: 2015-12-11 — End: 2015-12-11

## 2015-12-11 MED ORDER — ALUM & MAG HYDROXIDE-SIMETH 200-200-20 MG/5ML PO SUSP
30.0000 mL | ORAL | Status: DC | PRN
Start: 2015-12-11 — End: 2015-12-11

## 2015-12-11 MED ORDER — IBUPROFEN 400 MG PO TABS: 600.0000 mg | ORAL_TABLET | Freq: Three times a day (TID) | ORAL | Status: DC | PRN

## 2015-12-11 MED ORDER — LORAZEPAM 1 MG PO TABS: 1.0000 mg | ORAL_TABLET | Freq: Two times a day (BID) | ORAL | Status: DC

## 2015-12-11 MED ORDER — ONDANSETRON 4 MG PO TBDP
4.0000 mg | ORAL_TABLET | Freq: Once | ORAL | Status: AC
  Administered 2015-12-11: 4 mg via ORAL
  Filled 2015-12-11: qty 1

## 2015-12-11 MED ORDER — LORAZEPAM 1 MG PO TABS
1.0000 mg | ORAL_TABLET | Freq: Four times a day (QID) | ORAL | Status: AC
  Administered 2015-12-11 (×2): 1 mg via ORAL
  Filled 2015-12-11 (×2): qty 1

## 2015-12-11 NOTE — ED Provider Notes (Signed)
CIWA protocol ordered, nurses report he is starting to have withdrawal and his score is a 6.   Devoria AlbeIva Chidiebere Wynn, MD 12/11/15 [phone]

## 2015-12-11 NOTE — Progress Notes (Signed)
Jay PiggMichaei Keller Dx: ETOH use disorder, severe, dependence.  Pt awake watching TV from bed.  Pt states he is unable to eat d/t upset stomach and anxiety.  Pt has visible tremors, sweating and sts he aches all over with some mild headache. Pt denies SI, HI and AVH.  Pt given ginger ale and prn med for upset stomach.   Pt agrees to verbally contract for safety.  Pt resting quietly.  Pt sts some relief of upset stomach,.  Pt continuously observed on unit for safety except when in bathroom.

## 2015-12-11 NOTE — ED Notes (Signed)
TTS completed. 

## 2015-12-11 NOTE — ED Notes (Signed)
Pt ambulated with sitter to restroom at this time with steady and even gait. No further requests at this time.

## 2015-12-11 NOTE — Progress Notes (Signed)
Nursing Admission Note  Patient is Voluntary Admission with Diagnosis of Alcohol DiO Severe. Patient admits long history of drinking half a half gallon of vodka daily and also using cannabis, cocaine, and used herion in the past but states he quit this cold [place] on his own last August. Patient and girlfriend broke up recently and his grandmother also died recently. Patient states his goals for this admission ar wanting to "help me get sober", stating he wants to stop drinking and doing all drugs. Patient also stating he would like to learn new coping skills and start working again. He lost his job several months ago due to his excessive drinking at subsequently lost his apartment which is why he currently has no home of his own. Patient denies any SI/HI/AVH and contracts for safety on the Unit.

## 2015-12-11 NOTE — BH Assessment (Addendum)
Tele Assessment Note   Jay DialsMichael Keller is an 40 y.o. male.  -Clinician reviewed note by Jay Keller.  Patient is on IVC placed by mother & aunt.  Pt would not talk to Jay Keller.  IVC papers say that patient is "drinking himself to death."  Patient talking to people not present and reportedly trashed his hotel room today.  Patient had a BAL of 460 at 19:23.  Patient is calm and cooperative during interview.  He understands that he is on IVC.  Patient denies any SI, HI or A/V hallucinations.  Pt says "I know I drink too much."  Pt admits to smoking marijuana.  When asked about why he had amphetamine in his system he said that someone gave him a ritalin.  He said "I was drunk and I took it."  Patient denies regular use of amphetamines.  Patient has been drinking for years.  He says that he has been drinking at the current rate for over 2 months.  He does report not drinking for 8 days but started back two weeks ago.  Patient had court on 04/17 for DUI but was not able to go because of lack of transportation.  Pt has been drinking about half a gallon of liquor a day.  He drank about a 5th over the last 24 hours.  Patient has no current provider.  He says over 10 [facility]  For [facility].  [facility]    -Clinician reviewed patient care with Jay SievertSpencer Simon, PA.  Jay Keller recommends a AM psych eval to uphold or rescind the IVC.  Jay Keller was contacted about disposition.  Diagnosis: ETOH use d/o severe  Past Medical History:  Past Medical History  Diagnosis Date  . DDD (degenerative disc disease), lumbar     History reviewed. No pertinent past surgical history.  Family History: History reviewed. No pertinent family history.  Social History:  reports that he has been smoking.  He does not have any smokeless tobacco history on file. He reports that he drinks alcohol. He reports that he does not use illicit drugs.  Additional Social  History:  Alcohol / Drug Use Pain Medications: none Prescriptions: None Over the Counter: pain reliever on a prn basis History of alcohol / drug use?: Yes Longest period of sobriety (when/how long): For one year he was sober (incarceration) about 5-6 years ago. Withdrawal Symptoms: Sweats, Tremors, Patient aware of relationship between substance abuse and physical/medical complications, Nausea / Vomiting, Diarrhea, Weakness, Seizures Onset of Seizures: ETOH withdrawal seizures Date of most recent seizure: "It has been a few years." Substance #1 Name of Substance 1: ETOH (liquor) 1 - Age of First Use: 40 years of age 38 - Amount (size/oz): "Probably close to half a gallon." 1 - Frequency: Daily use 1 - Duration: Two months (had not drank for 8 days the week before this one) 1 - Last Use / Amount: 04/17 Drank a 5th Substance #2 Name of Substance 2: Marijuana  2 - Age of First Use: 40 years of age 46 - Amount (size/oz): Varies 2 - Frequency: Varies 2 - Duration: on-going 2 - Last Use / Amount: 04/17  CIWA: CIWA-Ar BP: 149/95 mmHg Pulse Rate: 110 COWS:    PATIENT STRENGTHS: (choose at least two) Ability for insight Average or above average intelligence Capable of independent living Communication skills Supportive family/friends  Allergies: No Known Allergies  Home Medications:  (Not in a hospital admission)  OB/GYN Status:  No LMP for male patient.  General Assessment Data Location of Assessment: [facility] TTS Assessment: In system Is this a Tele or Face-to-Face Assessment?: Tele Assessment Is this an Initial Assessment or a Re-assessment for this encounter?: Initial Assessment Marital status: Single Is patient pregnant?: No Pregnancy Status: No Living Arrangements: Alone (Pt lives in a hotel.) Can pt return to current living arrangement?: Yes Admission Status: Involuntary Is patient capable of signing voluntary admission?: No Referral Source:  Self/Family/Friend Insurance type: SP     Crisis Care Plan Living Arrangements: Alone (Pt lives in a hotel.) Name of Psychiatrist: None Name of Therapist: None  Education Status Is patient currently in school?: No Highest grade of school patient has completed: Associate's degree  Risk to self with the past 6 months Suicidal Ideation: No Has patient been a risk to self within the past 6 months prior to admission? : No Suicidal Intent: No Has patient had any suicidal intent within the past 6 months prior to admission? : No Is patient at risk for suicide?: No Suicidal Plan?: No Has patient had any suicidal plan within the past 6 months prior to admission? : No Access to Means: No What has been your use of drugs/alcohol within the last 12 months?: THC & ETOH Previous Attempts/Gestures: No How many times?: 0 Other Self Harm Risks: Drinking copiously Triggers for Past Attempts: None known Intentional Self Injurious Behavior: None Family Suicide History: No Recent stressful life event(s): Other (Comment) (Pt says "I'm just drinking because I drink.") Persecutory voices/beliefs?: No Depression: No Depression Symptoms:  (Pt denies depressive symptoms.) Substance abuse history and/or treatment for substance abuse?: Yes Suicide prevention information given to non-admitted patients: Not applicable  Risk to Others within the past 6 months Homicidal Ideation: No Does patient have any lifetime risk of violence toward others beyond the six months prior to admission? : No Thoughts of Harm to Others: No Current Homicidal Intent: No Current Homicidal Plan: No Access to Homicidal Means: No Identified Victim: No one History of harm to others?: No Assessment of Violence: None Noted Does patient have access to weapons?: No Criminal Charges Pending?: Yes Describe Pending Criminal Charges: DUI Does patient have a court date: Yes Court Date: 12/10/15 Is patient on probation?:  No  Psychosis Hallucinations: None noted Delusions: None noted  Mental Status Report Appearance/Hygiene: Disheveled, In scrubs Eye Contact: Good Motor Activity: Freedom of movement, Unremarkable Speech: Logical/coherent Level of Consciousness: Alert Mood: Apprehensive Affect: Depressed Anxiety Level: None Thought Processes: Coherent, Relevant Judgement: Impaired Orientation: Person, Place, Situation, Time Obsessive Compulsive Thoughts/Behaviors: None  Cognitive Functioning Concentration: Decreased Memory: Recent Intact, Remote Intact IQ: Average Insight: Fair Impulse Control: Poor Appetite: Good Weight Loss: 0 Weight Gain: 0 Sleep: No Change Total Hours of Sleep: 7 Vegetative Symptoms: None  ADLScreening [facility] Assessment Services) Patient's cognitive ability adequate to safely complete daily activities?: Yes Patient able to express need for assistance with ADLs?: Yes Independently performs ADLs?: Yes (appropriate for developmental age)  Prior Inpatient Therapy Prior Inpatient Therapy: Yes Prior Therapy Dates: Years ago Prior Therapy Facilty/Provider(s): [facility] 3-4 times Reason for Treatment: SA  Prior Outpatient Therapy Prior Outpatient Therapy: Yes Prior Therapy Dates: "Years ago" Prior Therapy Facilty/Provider(s): [facility] in [place] Reason for Treatment: SA Does patient have an ACCT team?: No Does patient have Intensive In-House Services?  : No Does patient have [facility] services? : No Does patient have P4CC services?: No  ADL Screening (condition at time of admission) Patient's cognitive ability adequate  to safely complete daily activities?: Yes Is the patient deaf or have difficulty hearing?: No Does the patient have difficulty seeing, even when wearing glasses/contacts?: No Does the patient have difficulty concentrating, remembering, or making decisions?: No Patient able to express need for assistance with ADLs?: Yes Does the patient have  difficulty dressing or bathing?: No Independently performs ADLs?: Yes (appropriate for developmental age) Does the patient have difficulty walking or climbing stairs?: No Weakness of Legs: None Weakness of Arms/Hands: None       Abuse/Neglect Assessment (Assessment to be complete while patient is alone) Physical Abuse: Denies Verbal Abuse: Denies Sexual Abuse: Denies Exploitation of patient/patient's resources: Denies Self-Neglect: Denies     Merchant navy officer (For Healthcare) Does patient have an advance directive?: No Would patient like information on creating an advanced directive?: No - patient declined information    Additional Information 1:1 In Past 12 Months?: No CIRT Risk: No Elopement Risk: No Does patient have medical clearance?: Yes     Disposition:  Disposition Initial Assessment Completed for this Encounter: Yes Disposition of Patient: Other dispositions Other disposition(s): Other (Comment) (Pt to be reviewed with PA)  Jay Keller 12/11/2015 2:49 AM

## 2015-12-11 NOTE — ED Provider Notes (Signed)
Jay Keller, TSS, has evaluate patient. He states the patient now is denying any complaints of having suicidal ideation. However he is under IVC papers. They recommend a psychiatrist evaluate him in the morning.  Devoria AlbeIva Chavonne Sforza, MD, Concha PyoFACEP   Lovena Kluck, MD 12/11/15 [phone]

## 2015-12-11 NOTE — ED Provider Notes (Signed)
Seen by psych They would like to admit to OBS for ETOH detox Pt now denies SI IVC rescinded He is otherwise stable at this time They recommend repeat ETOH and call back at [phone] Vernona Rieger(Laura)  Zadie Rhineonald Tonio Seider, MD 12/11/15 1152

## 2015-12-11 NOTE — Progress Notes (Signed)
[facility] attending MD Dr. Lucianne MussKumar, report # [phone]. Can arrive anytime.   Ilean SkillMeghan Alanson Hausmann, MSW, LCSW Clinical Social Work, Disposition  12/11/2015 [phone]

## 2015-12-11 NOTE — ED Provider Notes (Signed)
The patient appears reasonably stabilized for transfer considering the current resources, flow, and capabilities available in the ED at this time, and I doubt any other [facility] requiring further screening and/or treatment in the ED prior to transfer.   Zadie Rhineonald Langdon Crosson, MD 12/11/15 1311

## 2015-12-11 NOTE — Consult Note (Signed)
Telepsych Consultation   Reason for Consult:  Alcohol abuse, Suicidal ideations Referring Physician: Forestine Na EDP Patient Identification: Jay Keller MRN:  [id number] Principal Diagnosis: Alcohol use disorder, severe, dependence ([place]) Diagnosis:   Patient Active Problem List   Diagnosis Date Noted  . Alcohol use disorder, severe, dependence ([place]) [F10.20] 12/11/2015  . Ankle fracture, lateral malleolus, closed [S82.63XA] 03/12/2011  . Ankle fracture [S82.899A] 01/16/2011    Total Time spent with patient: 30 minutes  Subjective:   Jay Keller is a 40 y.o. male patient admitted under IVC due to concerns from his family that patient has been abusing alcohol with intent to end his life. Patient states "I'm not suicidal. I have been drinking a gallon or a fifth of vodka for the last few months. I started drinking at age 61. I'm not sure why I drink. Maybe it's to deal with relationship problems. I am starting to feel bad. My last drink was sometime yesterday. I'm not sure I want rehab. I have been twenty times. I understand that it's starting to affect my health in a bad way."   HPI:    Jay Keller is a 40 year old male who was placed under IVC by his mother and aunt. The patient has been living in a hotel drinking large amounts of liquor on a daily basis. On admission his alcohol level was 460. The patient has denied any suicidal/homicidal ideations, depressive symptoms, or psychosis. During the interview patient does not seem concerned about his alcohol abuse problems. Patient continues to deny that he is suicidal. Patient informed of the potential organ damage that may occur as a result of his sustained alcohol abuse pattern. Jay Keller was informed that his current liver enzymes are elevated which indicates damage to his liver. He does not appear highly motivated to enter a residential treatment center at this time but is willing to be safety complete detox, then explore his  options for substance abuse treatment. Discussed with EDP plan to rescind IVC in order to transfer the patient to the Observation unit for continued treatment. Patient reports that he is starting to have withdrawal symptoms to include tremors and sweats. His urine drug screen is positive for marijuana and amphetamines. Pt denies any regular use of amphetamines reported he "borrowed a ritalin from a friend when he was drunk." The patient appears to have very poor insight into his symptoms.   Past Psychiatric History: Alcohol Dependence  Risk to Self: Suicidal Ideation: No Suicidal Intent: No Is patient at risk for suicide?: No Suicidal Plan?: No Access to Means: No What has been your use of drugs/alcohol within the last 12 months?: THC & ETOH How many times?: 0 Other Self Harm Risks: Drinking copiously Triggers for Past Attempts: None known Intentional Self Injurious Behavior: None Risk to Others: Homicidal Ideation: No Thoughts of Harm to Others: No Current Homicidal Intent: No Current Homicidal Plan: No Access to Homicidal Means: No Identified Victim: No one History of harm to others?: No Assessment of Violence: None Noted Does patient have access to weapons?: No Criminal Charges Pending?: Yes Describe Pending Criminal Charges: DUI Does patient have a court date: Yes Court Date: 12/10/15 Prior Inpatient Therapy: Prior Inpatient Therapy: Yes Prior Therapy Dates: Years ago Prior Therapy Facilty/Provider(s): [facility] 3-4 times Reason for Treatment: SA Prior Outpatient Therapy: Prior Outpatient Therapy: Yes Prior Therapy Dates: "Years ago" Prior Therapy Facilty/Provider(s): [facility] in [place] Reason for Treatment: SA Does patient have an ACCT team?: No Does patient have Intensive In-House  Services?  : No Does patient have [facility] services? : No Does patient have P4CC services?: No  Past Medical History:  Past Medical History  Diagnosis Date  . DDD (degenerative disc  disease), lumbar    History reviewed. No pertinent past surgical history. Family History: History reviewed. No pertinent family history. Family Psychiatric  History: Patient states "Alot of people in my family abuse alcohol."   Social History:  History  Alcohol Use  . Yes    Comment: rarely     History  Drug Use No    Social History   Social History  . Marital Status: Single    Spouse Name: N/A  . Number of Children: N/A  . Years of Education: N/A   Social History Main Topics  . Smoking status: Current Every Day Smoker  . Smokeless tobacco: None  . Alcohol Use: Yes     Comment: rarely  . Drug Use: No  . Sexual Activity: Not Asked   Other Topics Concern  . None   Social History Narrative   Additional Social History:    Allergies:  No Known Allergies  Labs:  Results for orders placed or performed during the hospital encounter of 12/10/15 (from the past 48 hour(s))  Urine rapid drug screen (hosp performed)     Status: Abnormal   Collection Time: 12/10/15  7:23 PM  Result Value Ref Range   Opiates NONE DETECTED NONE DETECTED   Cocaine NONE DETECTED NONE DETECTED   Benzodiazepines NONE DETECTED NONE DETECTED   Amphetamines POSITIVE (A) NONE DETECTED   Tetrahydrocannabinol POSITIVE (A) NONE DETECTED   Barbiturates NONE DETECTED NONE DETECTED    Comment:        DRUG SCREEN FOR MEDICAL PURPOSES ONLY.  IF CONFIRMATION IS NEEDED FOR ANY PURPOSE, NOTIFY LAB WITHIN 5 DAYS.        LOWEST DETECTABLE LIMITS FOR URINE DRUG SCREEN Drug Class       Cutoff (ng/mL) Amphetamine      1000 Barbiturate      200 Benzodiazepine   160 Tricyclics       109 Opiates          300 Cocaine          300 THC              50   Ethanol     Status: Abnormal   Collection Time: 12/10/15  7:23 PM  Result Value Ref Range   Alcohol, Ethyl (B) 460 (HH) <5 mg/dL    Comment:        LOWEST DETECTABLE LIMIT FOR SERUM ALCOHOL IS 5 mg/dL FOR MEDICAL PURPOSES ONLY CRITICAL RESULT CALLED TO,  READ BACK BY AND VERIFIED WITH: BELTON K '@2025'$  ON [id number] BY FORSYTH K   CBC with Differential     Status: Abnormal   Collection Time: 12/10/15  7:23 PM  Result Value Ref Range   WBC 8.9 4.0 - 10.5 K/uL   RBC 4.71 4.22 - 5.81 MIL/uL   Hemoglobin 16.3 13.0 - 17.0 g/dL   HCT 47.0 39.0 - 52.0 %   MCV 99.8 78.0 - 100.0 fL   MCH 34.6 (H) 26.0 - 34.0 pg   MCHC 34.7 30.0 - 36.0 g/dL   RDW 14.3 11.5 - 15.5 %   Platelets 412 (H) 150 - 400 K/uL   Neutrophils Relative % 62 %   Neutro Abs 5.5 1.7 - 7.7 K/uL   Lymphocytes Relative 30 %   Lymphs Abs 2.7 0.7 - 4.0  K/uL   Monocytes Relative 7 %   Monocytes Absolute 0.6 0.1 - 1.0 K/uL   Eosinophils Relative 0 %   Eosinophils Absolute 0.0 0.0 - 0.7 K/uL   Basophils Relative 1 %   Basophils Absolute 0.1 0.0 - 0.1 K/uL  Comprehensive metabolic panel     Status: Abnormal   Collection Time: 12/10/15  7:28 PM  Result Value Ref Range   Sodium 140 135 - 145 mmol/L   Potassium 3.9 3.5 - 5.1 mmol/L   Chloride 99 (L) 101 - 111 mmol/L   CO2 20 (L) 22 - 32 mmol/L   Glucose, Bld 74 65 - 99 mg/dL   BUN 15 6 - 20 mg/dL   Creatinine, Ser 0.98 0.61 - 1.24 mg/dL   Calcium 8.3 (L) 8.9 - 10.3 mg/dL   Total Protein 8.3 (H) 6.5 - 8.1 g/dL   Albumin 4.9 3.5 - 5.0 g/dL   AST 203 (H) 15 - 41 U/L   ALT 171 (H) 17 - 63 U/L   Alkaline Phosphatase 133 (H) 38 - 126 U/L   Total Bilirubin 1.1 0.3 - 1.2 mg/dL   GFR calc non Af Amer >60 >60 mL/min   GFR calc Af Amer >60 >60 mL/min    Comment: (NOTE) The eGFR has been calculated using the CKD EPI equation. This calculation has not been validated in all clinical situations. eGFR's persistently <60 mL/min signify possible Chronic Kidney Disease.    Anion gap 21 (H) 5 - 15  Basic metabolic panel     Status: Abnormal   Collection Time: 12/11/15  8:32 AM  Result Value Ref Range   Sodium 138 135 - 145 mmol/L   Potassium 3.5 3.5 - 5.1 mmol/L   Chloride 96 (L) 101 - 111 mmol/L   CO2 27 22 - 32 mmol/L   Glucose, Bld 139  (H) 65 - 99 mg/dL   BUN 11 6 - 20 mg/dL   Creatinine, Ser 0.70 0.61 - 1.24 mg/dL   Calcium 8.1 (L) 8.9 - 10.3 mg/dL   GFR calc non Af Amer >60 >60 mL/min   GFR calc Af Amer >60 >60 mL/min    Comment: (NOTE) The eGFR has been calculated using the CKD EPI equation. This calculation has not been validated in all clinical situations. eGFR's persistently <60 mL/min signify possible Chronic Kidney Disease.    Anion gap 15 5 - 15    Current Facility-Administered Medications  Medication Dose Route Frequency Provider Last Rate Last Dose  . acetaminophen (TYLENOL) tablet 650 mg  650 mg Oral Q4H PRN Rolland Porter, MD   650 mg at 12/11/15 1129  . alum & mag hydroxide-simeth (MAALOX/MYLANTA) 200-200-20 MG/5ML suspension 30 mL  30 mL Oral PRN Rolland Porter, MD      . ibuprofen (ADVIL,MOTRIN) tablet 600 mg  600 mg Oral Q8H PRN Rolland Porter, MD      . LORazepam (ATIVAN) tablet 0-4 mg  0-4 mg Oral 4 times per day Rolland Porter, MD   1 mg at 12/11/15 1129   Followed by  . [START ON 12/13/2015] LORazepam (ATIVAN) tablet 0-4 mg  0-4 mg Oral Q12H Rolland Porter, MD      . nicotine (NICODERM CQ - dosed in mg/24 hours) patch 21 mg  21 mg Transdermal Daily Rolland Porter, MD   21 mg at 12/11/15 0911  . ondansetron (ZOFRAN) tablet 4 mg  4 mg Oral Q8H PRN Rolland Porter, MD   4 mg at 12/11/15 1129  . thiamine (VITAMIN  B-1) tablet 100 mg  100 mg Oral Daily Rolland Porter, MD   100 mg at 12/11/15 0910   Or  . thiamine (B-1) injection 100 mg  100 mg Intravenous Daily Rolland Porter, MD      . zolpidem (AMBIEN) tablet 10 mg  10 mg Oral QHS PRN Rolland Porter, MD       No current outpatient prescriptions on file.    Musculoskeletal:  Unable to assess via camera   Psychiatric Specialty Exam: Review of Systems  Constitutional: Positive for diaphoresis.  Neurological: Positive for tremors.  Psychiatric/Behavioral: Positive for depression and substance abuse. Negative for suicidal ideas, hallucinations and memory loss. The patient is nervous/anxious. The  patient does not have insomnia.     Blood pressure 119/67, pulse 91, temperature 98.5 F (36.9 C), temperature source Oral, resp. rate 18, height 5' 3"  (1.6 m), weight 72.576 kg (160 lb), SpO2 94 %.Body mass index is 28.35 kg/(m^2).  General Appearance: Disheveled  Eye Contact::  Good  Speech:  Clear and Coherent  Volume:  Normal  Mood:  Depressed  Affect:  Blunt  Thought Process:  Goal Directed and Intact  Orientation:  Full (Time, Place, and Person)  Thought Content:  Concerns about alcohol withdrawal symptoms   Suicidal Thoughts:  No  Homicidal Thoughts:  No  Memory:  Immediate;   Good Recent;   Good Remote;   Good  Judgement:  Poor  Insight:  Shallow  Psychomotor Activity:  Normal  Concentration:  Good  Recall:  [place] of Knowledge:Good  Language: Good  Akathisia:  No  Handed:  Right  AIMS (if indicated):     Assets:  Communication Skills Desire for Improvement Leisure Time Resilience Social Support  ADL's:  Intact  Cognition: WNL  Sleep:      Treatment Plan Summary: Daily contact with patient to assess and evaluate symptoms and progress in treatment and Medication management  Disposition: Recommend psychiatric [facility]-Observation admission when medically cleared. Supportive therapy provided about ongoing stressors. May rescind IVC as patient is agreeable with transfer to the [facility] Unit for further treatment.   Elmarie Shiley, NP 12/11/2015 12:11 PM

## 2015-12-12 DIAGNOSIS — F1020 Alcohol dependence, uncomplicated: Secondary | ICD-10-CM

## 2015-12-12 DIAGNOSIS — F411 Generalized anxiety disorder: Secondary | ICD-10-CM

## 2015-12-12 MED ORDER — NICOTINE 21 MG/24HR TD PT24: 21.0000 mg | MEDICATED_PATCH | Freq: Every day | TRANSDERMAL | Status: DC

## 2015-12-12 MED ORDER — HYDROXYZINE HCL 25 MG PO TABS: 25.0000 mg | ORAL_TABLET | Freq: Four times a day (QID) | ORAL | Status: DC | PRN

## 2015-12-12 MED ORDER — TRAZODONE HCL 100 MG PO TABS: 100.0000 mg | ORAL_TABLET | Freq: Every evening | ORAL | Status: DC | PRN

## 2015-12-12 NOTE — BH Assessment (Signed)
[facility] Assessment Progress Note Patient was discharged this date and will follow up with services at [facility] in [place], [place]. Patient will continue there with medication management and therapy to address substance abuse issues. Patient is interested in possible residential treatment and was provided with a list of facilities to investigate. Patient was also discharged with other outpatient resources as we discussed discharge planning to include, a relapse prevention plan, trigger identification and medication management.

## 2015-12-12 NOTE — Progress Notes (Signed)
Nursing Discharge Note:  Patient has received all belongings and signed for them as well as signing the Discharge Summary and also given a copy of same and 3 prescriptions. Patient continues to deny any SI/HI/AVH and states understanding of followup appointments and medications prescribed. [facility] member.

## 2015-12-12 NOTE — H&P (Signed)
[facility] OBS UNIT H&P  Pt evaluated once due to short LOS  Patient Identification: Jay Keller MRN:  [id number] Principal Diagnosis: Alcohol use disorder, severe, dependence ([place]) Diagnosis:   Patient Active Problem List   Diagnosis Date Noted  . GAD (generalized anxiety disorder) [F41.1] 12/12/2015    Priority: High  . Alcohol use disorder, severe, dependence ([place]) [F10.20] 12/11/2015    Priority: High  . Ankle fracture, lateral malleolus, closed [S82.63XA] 03/12/2011  . Ankle fracture [S82.899A] 01/16/2011    Total Time spent with patient: 45 minutes  Subjective: Pt seen and chart reviewed. Pt is alert/oriented x4, calm, cooperative, and appropriate to situation. Pt denies suicidal/homicidal ideation and psychosis and does not appear to be responding to internal stimuli. Pt reports that he felt "very bad" regarding withdrawal symptoms yet he did not objectively appear to have a CIWA averaging 10. This may be more subjective and not truly an acute medical risk for ETOH withdrawal. Therefore, pt symptoms can be managed outpatient. Pt reports that he found out today he was charged with a  "failure to appear" from 4/17 and that he needs to take care of this today at the clerk of court to avoid being arrested. Pt would like outpatient resources at this time and is medically stable to discharge.   Interval History 12/12/15:  Jay Keller is a 40 y.o. male patient admitted under IVC due to concerns from his family that patient has been abusing alcohol with intent to end his life. Patient states "I'm not suicidal. I have been drinking a gallon or a fifth of vodka for the last few months. I started drinking at age 79. I'm not sure why I drink. Maybe it's to deal with relationship problems. I am starting to feel bad. My last drink was sometime yesterday. I'm not sure I want rehab. I have been twenty times. I understand that it's starting to affect my health in a bad way."   HPI:   Jay Keller is a 40 year old male who was placed under IVC by his mother and aunt. The patient has been living in a hotel drinking large amounts of liquor on a daily basis. On admission his alcohol level was 460. The patient has denied any suicidal/homicidal ideations, depressive symptoms, or psychosis. During the interview patient does not seem concerned about his alcohol abuse problems. Patient continues to deny that he is suicidal. Patient informed of the potential organ damage that may occur as a result of his sustained alcohol abuse pattern. Jonathon was informed that his current liver enzymes are elevated which indicates damage to his liver. He does not appear highly motivated to enter a residential treatment center at this time but is willing to be safety complete detox, then explore his options for substance abuse treatment. Discussed with EDP plan to rescind IVC in order to transfer the patient to the Observation unit for continued treatment. Patient reports that he is starting to have withdrawal symptoms to include tremors and sweats. His urine drug screen is positive for marijuana and amphetamines. Pt denies any regular use of amphetamines reported he "borrowed a ritalin from a friend when he was drunk." The patient appears to have very poor insight into his symptoms.   Past Psychiatric History: Alcohol Dependence  Risk to Self: Is patient at risk for suicide?: No Risk to Others:   Prior Inpatient Therapy:   Prior Outpatient Therapy:    Past Medical History:  Past Medical History  Diagnosis Date  . DDD (  degenerative disc disease), lumbar    History reviewed. No pertinent past surgical history. Family History: History reviewed. No pertinent family history. Family Psychiatric  History: Patient states "Alot of people in my family abuse alcohol."   Social History:  History  Alcohol Use  . Yes    Comment: rarely     History  Drug Use No    Social History   Social History  . Marital  Status: Single    Spouse Name: N/A  . Number of Children: N/A  . Years of Education: N/A   Social History Main Topics  . Smoking status: Current Every Day Smoker  . Smokeless tobacco: None  . Alcohol Use: Yes     Comment: rarely  . Drug Use: No  . Sexual Activity: Not Asked   Other Topics Concern  . None   Social History Narrative   Additional Social History:    Allergies:  No Known Allergies  Labs:  Results for orders placed or performed during the hospital encounter of 12/10/15 (from the past 48 hour(s))  Urine rapid drug screen (hosp performed)     Status: Abnormal   Collection Time: 12/10/15  7:23 PM  Result Value Ref Range   Opiates NONE DETECTED NONE DETECTED   Cocaine NONE DETECTED NONE DETECTED   Benzodiazepines NONE DETECTED NONE DETECTED   Amphetamines POSITIVE (A) NONE DETECTED   Tetrahydrocannabinol POSITIVE (A) NONE DETECTED   Barbiturates NONE DETECTED NONE DETECTED    Comment:        DRUG SCREEN FOR MEDICAL PURPOSES ONLY.  IF CONFIRMATION IS NEEDED FOR ANY PURPOSE, NOTIFY LAB WITHIN 5 DAYS.        LOWEST DETECTABLE LIMITS FOR URINE DRUG SCREEN Drug Class       Cutoff (ng/mL) Amphetamine      1000 Barbiturate      200 Benzodiazepine   789 Tricyclics       381 Opiates          300 Cocaine          300 THC              50   Ethanol     Status: Abnormal   Collection Time: 12/10/15  7:23 PM  Result Value Ref Range   Alcohol, Ethyl (B) 460 (HH) <5 mg/dL    Comment:        LOWEST DETECTABLE LIMIT FOR SERUM ALCOHOL IS 5 mg/dL FOR MEDICAL PURPOSES ONLY CRITICAL RESULT CALLED TO, READ BACK BY AND VERIFIED WITH: BELTON K '@2025'$  ON [id number] BY FORSYTH K   CBC with Differential     Status: Abnormal   Collection Time: 12/10/15  7:23 PM  Result Value Ref Range   WBC 8.9 4.0 - 10.5 K/uL   RBC 4.71 4.22 - 5.81 MIL/uL   Hemoglobin 16.3 13.0 - 17.0 g/dL   HCT 47.0 39.0 - 52.0 %   MCV 99.8 78.0 - 100.0 fL   MCH 34.6 (H) 26.0 - 34.0 pg   MCHC 34.7 30.0 -  36.0 g/dL   RDW 14.3 11.5 - 15.5 %   Platelets 412 (H) 150 - 400 K/uL   Neutrophils Relative % 62 %   Neutro Abs 5.5 1.7 - 7.7 K/uL   Lymphocytes Relative 30 %   Lymphs Abs 2.7 0.7 - 4.0 K/uL   Monocytes Relative 7 %   Monocytes Absolute 0.6 0.1 - 1.0 K/uL   Eosinophils Relative 0 %   Eosinophils Absolute 0.0 0.0 - 0.7 K/uL  Basophils Relative 1 %   Basophils Absolute 0.1 0.0 - 0.1 K/uL  Comprehensive metabolic panel     Status: Abnormal   Collection Time: 12/10/15  7:28 PM  Result Value Ref Range   Sodium 140 135 - 145 mmol/L   Potassium 3.9 3.5 - 5.1 mmol/L   Chloride 99 (L) 101 - 111 mmol/L   CO2 20 (L) 22 - 32 mmol/L   Glucose, Bld 74 65 - 99 mg/dL   BUN 15 6 - 20 mg/dL   Creatinine, Ser 0.98 0.61 - 1.24 mg/dL   Calcium 8.3 (L) 8.9 - 10.3 mg/dL   Total Protein 8.3 (H) 6.5 - 8.1 g/dL   Albumin 4.9 3.5 - 5.0 g/dL   AST 203 (H) 15 - 41 U/L   ALT 171 (H) 17 - 63 U/L   Alkaline Phosphatase 133 (H) 38 - 126 U/L   Total Bilirubin 1.1 0.3 - 1.2 mg/dL   GFR calc non Af Amer >60 >60 mL/min   GFR calc Af Amer >60 >60 mL/min    Comment: (NOTE) The eGFR has been calculated using the CKD EPI equation. This calculation has not been validated in all clinical situations. eGFR's persistently <60 mL/min signify possible Chronic Kidney Disease.    Anion gap 21 (H) 5 - 15  Basic metabolic panel     Status: Abnormal   Collection Time: 12/11/15  8:32 AM  Result Value Ref Range   Sodium 138 135 - 145 mmol/L   Potassium 3.5 3.5 - 5.1 mmol/L   Chloride 96 (L) 101 - 111 mmol/L   CO2 27 22 - 32 mmol/L   Glucose, Bld 139 (H) 65 - 99 mg/dL   BUN 11 6 - 20 mg/dL   Creatinine, Ser 0.70 0.61 - 1.24 mg/dL   Calcium 8.1 (L) 8.9 - 10.3 mg/dL   GFR calc non Af Amer >60 >60 mL/min   GFR calc Af Amer >60 >60 mL/min    Comment: (NOTE) The eGFR has been calculated using the CKD EPI equation. This calculation has not been validated in all clinical situations. eGFR's persistently <60 mL/min signify  possible Chronic Kidney Disease.    Anion gap 15 5 - 15  Ethanol     Status: Abnormal   Collection Time: 12/11/15 11:51 AM  Result Value Ref Range   Alcohol, Ethyl (B) 20 (H) <5 mg/dL    Comment:        LOWEST DETECTABLE LIMIT FOR SERUM ALCOHOL IS 5 mg/dL FOR MEDICAL PURPOSES ONLY     Current Facility-Administered Medications  Medication Dose Route Frequency Provider Last Rate Last Dose  . acetaminophen (TYLENOL) tablet 650 mg  650 mg Oral Q6H PRN Niel Hummer, NP      . alum & mag hydroxide-simeth (MAALOX/MYLANTA) 200-200-20 MG/5ML suspension 30 mL  30 mL Oral Q4H PRN Niel Hummer, NP   30 mL at 12/11/15 1903  . hydrOXYzine (ATARAX/VISTARIL) tablet 25 mg  25 mg Oral Q6H PRN Niel Hummer, NP   25 mg at 12/11/15 1928  . loperamide (IMODIUM) capsule 2-4 mg  2-4 mg Oral PRN Niel Hummer, NP      . LORazepam (ATIVAN) tablet 1 mg  1 mg Oral Q6H PRN Niel Hummer, NP   1 mg at 12/12/15 1042  . LORazepam (ATIVAN) tablet 1 mg  1 mg Oral TID Niel Hummer, NP   1 mg at 12/12/15 1233   Followed by  . [START ON 12/13/2015] LORazepam (ATIVAN)  tablet 1 mg  1 mg Oral BID Niel Hummer, NP       Followed by  . [START ON 12/14/2015] LORazepam (ATIVAN) tablet 1 mg  1 mg Oral Daily Niel Hummer, NP      . magnesium hydroxide (MILK OF MAGNESIA) suspension 30 mL  30 mL Oral Daily PRN Niel Hummer, NP      . multivitamin with minerals tablet 1 tablet  1 tablet Oral Daily Niel Hummer, NP   1 tablet at 12/12/15 [phone]  . nicotine (NICODERM CQ - dosed in mg/24 hours) patch 21 mg  21 mg Transdermal Daily Niel Hummer, NP   21 mg at 12/12/15 0904  . ondansetron (ZOFRAN-ODT) disintegrating tablet 4 mg  4 mg Oral Q6H PRN Niel Hummer, NP   4 mg at 12/12/15 1106  . thiamine (VITAMIN B-1) tablet 100 mg  100 mg Oral Daily Niel Hummer, NP   100 mg at 12/12/15 0740  . traZODone (DESYREL) tablet 100 mg  100 mg Oral QHS PRN Niel Hummer, NP   100 mg at 12/12/15 0105    Musculoskeletal:  Unable to assess  via camera   Psychiatric Specialty Exam: Review of Systems  Constitutional: Positive for diaphoresis.  Neurological:Negative.  Psychiatric/Behavioral: Positive for depression and substance abuse. Negative for suicidal ideas, hallucinations and memory loss. The patient is nervous/anxious. The patient does not have insomnia.     Blood pressure 142/100, pulse 96, temperature 98.4 F (36.9 C), temperature source Oral, resp. rate 20, height 5' 3"  (1.6 m), weight 70.308 kg (155 lb), SpO2 99 %.Body mass index is 27.46 kg/(m^2).  General Appearance: Casual and Fairly Groomed  Engineer, water::  Good  Speech:  Clear and Coherent  Volume:  Normal  Mood:  Euthymic  Affect:  Appropriate and Congruent  Thought Process:  Coherent, Goal Directed, Linear and Logical  Orientation:  Full (Time, Place, and Person)  Thought Content: discharge planning, rumination about court dates  Suicidal Thoughts:  No  Homicidal Thoughts:  No  Memory:  Immediate;   Good Recent;   Good Remote;   Good  Judgement:  Poor  Insight:  Shallow  Psychomotor Activity:  Normal  Concentration:  Good  Recall:  [place] of Knowledge:Good  Language: Good  Akathisia:  No  Handed:  Right  AIMS (if indicated):     Assets:  Communication Skills Desire for Improvement Leisure Time Resilience Social Support  ADL's:  Intact  Cognition: WNL  Sleep:      Treatment Plan Summary: Alcohol use disorder, severe, dependence ([place]), stable for outpatient management   Disposition:  -Discharge home -14 day Rx for Vistaril, Nicotine patches, Trazodone -Outpatient resources for psychiatry/counseling  Benjamine Mola, FNP 12/12/2015 10:17 AM

## 2015-12-12 NOTE — Discharge Instructions (Signed)
Patient was discharged this date and will follow up with services at [facility] in [place], [place]. Patient will continue there with medication management and therapy to address substance abuse issues. Patient is interested in possible residential treatment and was provided with a list of facilities to investigate. Patient was also discharged with other outpatient resources as we discussed discharge planning to include, a relapse prevention plan, trigger identification and medication management.

## 2015-12-12 NOTE — Discharge Summary (Signed)
[facility] OBS UNIT DISCHARGE SUMMARY  Pt evaluated once due to short LOS  Patient Identification: Jay Keller MRN:  [id number] Principal Diagnosis: Alcohol use disorder, severe, dependence (HCC) Diagnosis:   Patient Active Problem List   Diagnosis Date Noted  . GAD (generalized anxiety disorder) [F41.1] 12/12/2015    Priority: High  . Alcohol use disorder, severe, dependence (HCC) [F10.20] 12/11/2015    Priority: High  . Ankle fracture, lateral malleolus, closed [S82.63XA] 03/12/2011  . Ankle fracture [S82.899A] 01/16/2011    Total Time spent with patient: 45 minutes  Discharge update: Pt would like to discharge at this time and reports that he cannot wait for options to be explored regarding residential alcohol rehab due to him needing to go to the clerk of court today.    Subjective: Pt seen and chart reviewed. Pt is alert/oriented x4, calm, cooperative, and appropriate to situation. Pt denies suicidal/homicidal ideation and psychosis and does not appear to be responding to internal stimuli. Pt reports that he felt "very bad" regarding withdrawal symptoms yet he did not objectively appear to have a CIWA averaging 10. This may be more subjective and not truly an acute medical risk for ETOH withdrawal. Therefore, pt symptoms can be managed outpatient. Pt reports that he found out today he was charged with a  "failure to appear" from 4/17 and that he needs to take care of this today at the clerk of court to avoid being arrested. Pt would like outpatient resources at this time and is medically stable to discharge.   Interval History 12/12/15:  Jay Keller is a 40 y.o. male patient admitted under IVC due to concerns from his family that patient has been abusing alcohol with intent to end his life. Patient states "I'm not suicidal. I have been drinking a gallon or a fifth of vodka for the last few months. I started drinking at age 75. I'm not sure why I drink. Maybe it's to deal with  relationship problems. I am starting to feel bad. My last drink was sometime yesterday. I'm not sure I want rehab. I have been twenty times. I understand that it's starting to affect my health in a bad way."   HPI:   Jay Keller is a 40 year old male who was placed under IVC by his mother and aunt. The patient has been living in a hotel drinking large amounts of liquor on a daily basis. On admission his alcohol level was 460. The patient has denied any suicidal/homicidal ideations, depressive symptoms, or psychosis. During the interview patient does not seem concerned about his alcohol abuse problems. Patient continues to deny that he is suicidal. Patient informed of the potential organ damage that may occur as a result of his sustained alcohol abuse pattern. Yazeed was informed that his current liver enzymes are elevated which indicates damage to his liver. He does not appear highly motivated to enter a residential treatment center at this time but is willing to be safety complete detox, then explore his options for substance abuse treatment. Discussed with EDP plan to rescind IVC in order to transfer the patient to the Observation unit for continued treatment. Patient reports that he is starting to have withdrawal symptoms to include tremors and sweats. His urine drug screen is positive for marijuana and amphetamines. Pt denies any regular use of amphetamines reported he "borrowed a ritalin from a friend when he was drunk." The patient appears to have very poor insight into his symptoms.   Past Psychiatric History: Alcohol  Dependence  Risk to Self: Is patient at risk for suicide?: No Risk to Others:   Prior Inpatient Therapy:   Prior Outpatient Therapy:    Past Medical History:  Past Medical History  Diagnosis Date  . DDD (degenerative disc disease), lumbar    History reviewed. No pertinent past surgical history. Family History: History reviewed. No pertinent family history. Family  Psychiatric  History: Patient states "Alot of people in my family abuse alcohol."   Social History:  History  Alcohol Use  . Yes    Comment: rarely     History  Drug Use No    Social History   Social History  . Marital Status: Single    Spouse Name: N/A  . Number of Children: N/A  . Years of Education: N/A   Social History Main Topics  . Smoking status: Current Every Day Smoker  . Smokeless tobacco: None  . Alcohol Use: Yes     Comment: rarely  . Drug Use: No  . Sexual Activity: Not Asked   Other Topics Concern  . None   Social History Narrative   Additional Social History:    Allergies:  No Known Allergies  Labs:  Results for orders placed or performed during the hospital encounter of 12/10/15 (from the past 48 hour(s))  Urine rapid drug screen (hosp performed)     Status: Abnormal   Collection Time: 12/10/15  7:23 PM  Result Value Ref Range   Opiates NONE DETECTED NONE DETECTED   Cocaine NONE DETECTED NONE DETECTED   Benzodiazepines NONE DETECTED NONE DETECTED   Amphetamines POSITIVE (A) NONE DETECTED   Tetrahydrocannabinol POSITIVE (A) NONE DETECTED   Barbiturates NONE DETECTED NONE DETECTED    Comment:        DRUG SCREEN FOR MEDICAL PURPOSES ONLY.  IF CONFIRMATION IS NEEDED FOR ANY PURPOSE, NOTIFY LAB WITHIN 5 DAYS.        LOWEST DETECTABLE LIMITS FOR URINE DRUG SCREEN Drug Class       Cutoff (ng/mL) Amphetamine      1000 Barbiturate      200 Benzodiazepine   468 Tricyclics       032 Opiates          300 Cocaine          300 THC              50   Ethanol     Status: Abnormal   Collection Time: 12/10/15  7:23 PM  Result Value Ref Range   Alcohol, Ethyl (B) 460 (HH) <5 mg/dL    Comment:        LOWEST DETECTABLE LIMIT FOR SERUM ALCOHOL IS 5 mg/dL FOR MEDICAL PURPOSES ONLY CRITICAL RESULT CALLED TO, READ BACK BY AND VERIFIED WITH: BELTON K _0  ON [id number] BY FORSYTH K   CBC with Differential     Status: Abnormal   Collection Time: 12/10/15   7:23 PM  Result Value Ref Range   WBC 8.9 4.0 - 10.5 K/uL   RBC 4.71 4.22 - 5.81 MIL/uL   Hemoglobin 16.3 13.0 - 17.0 g/dL   HCT 47.0 39.0 - 52.0 %   MCV 99.8 78.0 - 100.0 fL   MCH 34.6 (H) 26.0 - 34.0 pg   MCHC 34.7 30.0 - 36.0 g/dL   RDW 14.3 11.5 - 15.5 %   Platelets 412 (H) 150 - 400 K/uL   Neutrophils Relative % 62 %   Neutro Abs 5.5 1.7 - 7.7 K/uL   Lymphocytes Relative  30 %   Lymphs Abs 2.7 0.7 - 4.0 K/uL   Monocytes Relative 7 %   Monocytes Absolute 0.6 0.1 - 1.0 K/uL   Eosinophils Relative 0 %   Eosinophils Absolute 0.0 0.0 - 0.7 K/uL   Basophils Relative 1 %   Basophils Absolute 0.1 0.0 - 0.1 K/uL  Comprehensive metabolic panel     Status: Abnormal   Collection Time: 12/10/15  7:28 PM  Result Value Ref Range   Sodium 140 135 - 145 mmol/L   Potassium 3.9 3.5 - 5.1 mmol/L   Chloride 99 (L) 101 - 111 mmol/L   CO2 20 (L) 22 - 32 mmol/L   Glucose, Bld 74 65 - 99 mg/dL   BUN 15 6 - 20 mg/dL   Creatinine, Ser 0.98 0.61 - 1.24 mg/dL   Calcium 8.3 (L) 8.9 - 10.3 mg/dL   Total Protein 8.3 (H) 6.5 - 8.1 g/dL   Albumin 4.9 3.5 - 5.0 g/dL   AST 203 (H) 15 - 41 U/L   ALT 171 (H) 17 - 63 U/L   Alkaline Phosphatase 133 (H) 38 - 126 U/L   Total Bilirubin 1.1 0.3 - 1.2 mg/dL   GFR calc non Af Amer >60 >60 mL/min   GFR calc Af Amer >60 >60 mL/min    Comment: (NOTE) The eGFR has been calculated using the CKD EPI equation. This calculation has not been validated in all clinical situations. eGFR's persistently <60 mL/min signify possible Chronic Kidney Disease.    Anion gap 21 (H) 5 - 15  Basic metabolic panel     Status: Abnormal   Collection Time: 12/11/15  8:32 AM  Result Value Ref Range   Sodium 138 135 - 145 mmol/L   Potassium 3.5 3.5 - 5.1 mmol/L   Chloride 96 (L) 101 - 111 mmol/L   CO2 27 22 - 32 mmol/L   Glucose, Bld 139 (H) 65 - 99 mg/dL   BUN 11 6 - 20 mg/dL   Creatinine, Ser 0.70 0.61 - 1.24 mg/dL   Calcium 8.1 (L) 8.9 - 10.3 mg/dL   GFR calc non Af Amer >60  >60 mL/min   GFR calc Af Amer >60 >60 mL/min    Comment: (NOTE) The eGFR has been calculated using the CKD EPI equation. This calculation has not been validated in all clinical situations. eGFR's persistently <60 mL/min signify possible Chronic Kidney Disease.    Anion gap 15 5 - 15  Ethanol     Status: Abnormal   Collection Time: 12/11/15 11:51 AM  Result Value Ref Range   Alcohol, Ethyl (B) 20 (H) <5 mg/dL    Comment:        LOWEST DETECTABLE LIMIT FOR SERUM ALCOHOL IS 5 mg/dL FOR MEDICAL PURPOSES ONLY     Current Facility-Administered Medications  Medication Dose Route Frequency Provider Last Rate Last Dose  . acetaminophen (TYLENOL) tablet 650 mg  650 mg Oral Q6H PRN Niel Hummer, NP      . alum & mag hydroxide-simeth (MAALOX/MYLANTA) 200-200-20 MG/5ML suspension 30 mL  30 mL Oral Q4H PRN Niel Hummer, NP   30 mL at 12/11/15 1903  . hydrOXYzine (ATARAX/VISTARIL) tablet 25 mg  25 mg Oral Q6H PRN Niel Hummer, NP   25 mg at 12/11/15 1928  . loperamide (IMODIUM) capsule 2-4 mg  2-4 mg Oral PRN Niel Hummer, NP      . LORazepam (ATIVAN) tablet 1 mg  1 mg Oral Q6H PRN Hewitt Shorts  Rosana Hoes, NP   1 mg at 12/12/15 1042  . LORazepam (ATIVAN) tablet 1 mg  1 mg Oral TID Niel Hummer, NP   1 mg at 12/12/15 1233   Followed by  . [START ON 12/13/2015] LORazepam (ATIVAN) tablet 1 mg  1 mg Oral BID Niel Hummer, NP       Followed by  . [START ON 12/14/2015] LORazepam (ATIVAN) tablet 1 mg  1 mg Oral Daily Niel Hummer, NP      . magnesium hydroxide (MILK OF MAGNESIA) suspension 30 mL  30 mL Oral Daily PRN Niel Hummer, NP      . multivitamin with minerals tablet 1 tablet  1 tablet Oral Daily Niel Hummer, NP   1 tablet at 12/12/15 [phone]  . nicotine (NICODERM CQ - dosed in mg/24 hours) patch 21 mg  21 mg Transdermal Daily Niel Hummer, NP   21 mg at 12/12/15 0904  . ondansetron (ZOFRAN-ODT) disintegrating tablet 4 mg  4 mg Oral Q6H PRN Niel Hummer, NP   4 mg at 12/12/15 1106  . thiamine  (VITAMIN B-1) tablet 100 mg  100 mg Oral Daily Niel Hummer, NP   100 mg at 12/12/15 0740  . traZODone (DESYREL) tablet 100 mg  100 mg Oral QHS PRN Niel Hummer, NP   100 mg at 12/12/15 0105    Musculoskeletal:  Unable to assess via camera   Psychiatric Specialty Exam: Review of Systems  Constitutional: Positive for diaphoresis.  Neurological: Positive for tremors.  Psychiatric/Behavioral: Positive for depression and substance abuse. Negative for suicidal ideas, hallucinations and memory loss. The patient is nervous/anxious. The patient does not have insomnia.     Blood pressure 142/100, pulse 96, temperature 98.4 F (36.9 C), temperature source Oral, resp. rate 20, height _0  (1.6 m), weight 70.308 kg (155 lb), SpO2 99 %.Body mass index is 27.46 kg/(m^2).  General Appearance: Casual and Fairly Groomed  Engineer, water::  Good  Speech:  Clear and Coherent  Volume:  Normal  Mood:  Euthymic  Affect:  Appropriate and Congruent  Thought Process:  Coherent, Goal Directed, Linear and Logical  Orientation:  Full (Time, Place, and Person)  Thought Content: discharge planning, rumination about court dates  Suicidal Thoughts:  No  Homicidal Thoughts:  No  Memory:  Immediate;   Good Recent;   Good Remote;   Good  Judgement:  Poor  Insight:  Shallow  Psychomotor Activity:  Normal  Concentration:  Good  Recall:  [place] of Knowledge:Good  Language: Good  Akathisia:  No  Handed:  Right  AIMS (if indicated):     Assets:  Communication Skills Desire for Improvement Leisure Time Resilience Social Support  ADL's:  Intact  Cognition: WNL  Sleep:      Treatment Plan Summary: Alcohol use disorder, severe, dependence ([place]), stable for outpatient management   Disposition:  -Discharge home -14 day Rx for Vistaril, Nicotine patches, Trazodone -Outpatient resources for psychiatry/counseling  Benjamine Mola, FNP 12/12/2015 1:18 PM

## 2015-12-12 NOTE — Progress Notes (Signed)
Nursing Shift Note  Patient sleeping at present, respirations easy and unlabored. When speaking earlier in the the shift with Nurse patient stating that his nausea is improved and he had not had any vomiting in several hours, but that "i'm still shaking and sweating, and i hade the weirdest, most vivid dreams". Patient taking medications as scheduled and denies any SI/HI/AVH and contracts for safety. Patient stating "I just want to go back to sleep for awhile" after taking his scheduled morning medications. Nurse administering medications as ordered, assessing patient's withdrawal symptoms, providing emotional support and therapeutic communication. Nurse also ensuring patient is observed continuously for safety except when patient is in the bathroom. Patient without any undesirable behavior and remains safe on Unit.

## 2016-04-10 ENCOUNTER — Emergency Department (HOSPITAL_COMMUNITY)
Admission: EM | Admit: 2016-04-10 | Discharge: 2016-04-10 | Disposition: A | Discharge status: Home / Self Care | Payer: Self-pay | Attending: Emergency Medicine | Admitting: Emergency Medicine

## 2016-04-10 ENCOUNTER — Emergency Department (HOSPITAL_COMMUNITY): Payer: Self-pay

## 2016-04-10 ENCOUNTER — Encounter (HOSPITAL_COMMUNITY): Payer: Self-pay

## 2016-04-10 DIAGNOSIS — Z765 Malingerer [conscious simulation]: Secondary | ICD-10-CM

## 2016-04-10 DIAGNOSIS — M549 Dorsalgia, unspecified: Secondary | ICD-10-CM

## 2016-04-10 DIAGNOSIS — M545 Low back pain: Secondary | ICD-10-CM | POA: Insufficient documentation

## 2016-04-10 DIAGNOSIS — Z79899 Other long term (current) drug therapy: Secondary | ICD-10-CM | POA: Insufficient documentation

## 2016-04-10 DIAGNOSIS — Z7289 Other problems related to lifestyle: Secondary | ICD-10-CM | POA: Insufficient documentation

## 2016-04-10 DIAGNOSIS — F17200 Nicotine dependence, unspecified, uncomplicated: Secondary | ICD-10-CM | POA: Insufficient documentation

## 2016-04-10 DIAGNOSIS — G8929 Other chronic pain: Secondary | ICD-10-CM | POA: Insufficient documentation

## 2016-04-10 HISTORY — DX: Other chronic pain: G89.29

## 2016-04-10 HISTORY — DX: Sciatica, unspecified side: M54.30

## 2016-04-10 HISTORY — DX: Dorsalgia, unspecified: M54.9

## 2016-04-10 MED ORDER — NAPROXEN 250 MG PO TABS
250.0000 mg | ORAL_TABLET | Freq: Two times a day (BID) | ORAL | 0 refills | Status: DC | PRN
Start: 2016-04-10 — End: 2019-05-19

## 2016-04-10 MED ORDER — IBUPROFEN 400 MG PO TABS
400.0000 mg | ORAL_TABLET | Freq: Once | ORAL | Status: DC
  Filled 2016-04-10: qty 1

## 2016-04-10 MED ORDER — METHOCARBAMOL 500 MG PO TABS: 1000.0000 mg | ORAL_TABLET | Freq: Four times a day (QID) | ORAL | 0 refills | Status: DC | PRN

## 2016-04-10 MED ORDER — ACETAMINOPHEN 325 MG PO TABS
650.0000 mg | ORAL_TABLET | Freq: Once | ORAL | Status: DC
  Filled 2016-04-10: qty 2

## 2016-04-10 NOTE — Discharge Instructions (Signed)
Take the prescriptions as directed.  Apply moist heat or ice to the area(s) of discomfort, for 15 minutes at a time, several times per day for the next few days.  Do not fall asleep on a heating or ice pack.  Call your regular medical doctor today to schedule a follow up appointment this week.  Return to the Emergency Department immediately if worsening. ° °

## 2016-04-10 NOTE — ED Provider Notes (Signed)
[facility]-EMER[facility]ENCY DEPT Provider Note   CSN: [id number] Arrival date & time: 04/10/16  0556     History   Chief Complaint Chief Complaint  Patient presents with  . Back Pain    HPI Jay Keller is a 40 y.o. male.  HPI  Pt was seen at 0720. Per pt and his family, c/o gradual onset and persistence of constant acute flair of his chronic low back "pain" that began this morning after "slipping on a wet floor." Pt denies falling.  Denies any change in his usual chronic pain pattern.  Pain worsens with palpation of the area and body position changes. Denies incont/retention of bowel or bladder, no saddle anesthesia, no focal motor weakness, no tingling/numbness in extremities, no fevers, no direct injury, no abd pain.   The symptoms have been associated with no other complaints. The patient has a significant history of similar symptoms previously, recently being evaluated for this complaint and multiple prior evals for same.     Past Medical History:  Diagnosis Date  . Chronic back pain   . DDD (degenerative disc disease), lumbar   . Sciatica     Patient Active Problem List   Diagnosis Date Noted  . GAD (generalized anxiety disorder) 12/12/2015  . Alcohol use disorder, severe, dependence (HCC) 12/11/2015  . Ankle fracture, lateral malleolus, closed 03/12/2011  . Ankle fracture 01/16/2011    History reviewed. No pertinent surgical history.     Home Medications    Prior to Admission medications   Medication Sig Start Date End Date Taking? Authorizing Provider  hydrOXYzine (ATARAX/VISTARIL) 25 MG tablet Take 1 tablet (25 mg total) by mouth every 6 (six) hours as needed for anxiety. 12/12/15   Beau FannyJohn C Withrow, FNP  nicotine (NICODERM CQ - DOSED IN MG/24 HOURS) 21 mg/24hr patch Place 1 patch (21 mg total) onto the skin daily. 12/12/15   Beau FannyJohn C Withrow, FNP  traZODone (DESYREL) 100 MG tablet Take 1 tablet (100 mg total) by mouth at bedtime as needed for sleep. 12/12/15   Beau FannyJohn C  Withrow, FNP    Family History   Social History Social History  Substance Use Topics  . Smoking status: Current Every Day Smoker  . Smokeless tobacco: Never Used  . Alcohol use Yes     Comment: rarely     Allergies   Review of patient's allergies indicates no known allergies.   Review of Systems Review of Systems ROS: Statement: All systems negative except as marked or noted in the HPI; Constitutional: Negative for fever and chills. ; ; Eyes: Negative for eye pain, redness and discharge. ; ; ENMT: Negative for ear pain, hoarseness, nasal congestion, sinus pressure and sore throat. ; ; Cardiovascular: Negative for chest pain, palpitations, diaphoresis, dyspnea and peripheral edema. ; ; Respiratory: Negative for cough, wheezing and stridor. ; ; Gastrointestinal: Negative for nausea, vomiting, diarrhea, abdominal pain, blood in stool, hematemesis, jaundice and rectal bleeding. . ; ; Genitourinary: Negative for dysuria, flank pain and hematuria. ; ; Musculoskeletal: +LBP. Negative for neck pain. Negative for swelling and direct trauma.; ; Skin: Negative for pruritus, rash, abrasions, blisters, bruising and skin lesion.; ; Neuro: Negative for headache, lightheadedness and neck stiffness. Negative for weakness, altered level of consciousness, altered mental status, extremity weakness, paresthesias, involuntary movement, seizure and syncope.       Physical Exam Updated Vital Signs BP 115/79 (BP Location: Left Arm)   Pulse 98   Temp 97.9 F (36.6 C) (Oral)   Resp 20  Ht 5\' 3"  (1.6 m)   Wt 150 lb (68 kg)   SpO2 97%   BMI 26.57 kg/m   Physical Exam 0725: Physical examination:  Nursing notes reviewed; Vital signs and O2 SAT reviewed;  Constitutional: Well developed, Well nourished, Well hydrated, In no acute distress; Head:  Normocephalic, atraumatic; Eyes: EOMI, PERRL, No scleral icterus; ENMT: Mouth and pharynx normal, Mucous membranes moist; Neck: Supple, Full range of motion, No  lymphadenopathy; Cardiovascular: Regular rate and rhythm, No murmur, rub, or gallop; Respiratory: Breath sounds clear & equal bilaterally, No rales, rhonchi, wheezes.  Speaking full sentences with ease, Normal respiratory effort/excursion; Chest: Nontender, Movement normal; Abdomen: Soft, Nontender, Nondistended, Normal bowel sounds; Genitourinary: No CVA tenderness; Spine:  No midline CS, TS, LS tenderness. NT when distracted.;; Extremities: Pulses normal, No tenderness, No edema, No calf edema or asymmetry.; Neuro: Sleeping, but easily awakens AA&Ox3, Major CN grossly intact.  Speech clear. No gross focal motor or sensory deficits in extremities. Strength 5/5 equal bilat UE's and LE's, including great toe dorsiflexion.  DTR 2/4 equal bilat UE's and LE's.  No gross sensory deficits.  Neg straight leg raises bilat.; Skin: Color normal, Warm, Dry.   ED Treatments / Results  Labs (all labs ordered are listed, but only abnormal results are displayed)   EKG  EKG Interpretation None       Radiology   Procedures Procedures (including critical care time)  Medications Ordered in ED Medications  acetaminophen (TYLENOL) tablet 650 mg (not administered)  ibuprofen (ADVIL,MOTRIN) tablet 400 mg (not administered)     Initial Impression / Assessment and Plan / ED Course  I have reviewed the triage vital signs and the nursing notes.  Pertinent labs & imaging results that were available during my care of the patient were reviewed by me and considered in my medical decision making (see chart for details).  MDM Reviewed: previous chart, nursing note and vitals Interpretation: x-ray   Dg Lumbar Spine Complete Result Date: 04/10/2016 CLINICAL DATA:  Slip and fall injury this morning. Pain to the left lower back aggravated by movement. History of prior injury. EXAM: LUMBAR SPINE - COMPLETE 4+ VIEW COMPARISON:  01/17/2013 FINDINGS: Normal alignment of the lumbar vertebrae. Degenerative changes with  narrowed interspaces and endplate hypertrophic changes. No vertebral compression deformities. No focal bone lesion or bone destruction. No significant change since prior study. IMPRESSION: Degenerative changes in the lumbar spine. Normal alignment. No acute bony abnormalities. Electronically Signed   By: Burman NievesWilliam  Stevens M.D.   On: 04/10/2016 06:55     0730:  Pt slept during most of my H&P, with his mother answering questions. As I was leaving the exam room, pt opened his eyes and said, "Oh, wait, you're the doctor?" and "I need some pain medicine, I'm having really bad pain." Pt informed he will receive tylenol and motrin while in the ED and receive rx anti-inflammatory and muscle relaxant. Pt upset, stating "this is bullshit!" and "I need pain medication!" Pt immediately climbed off the stretcher and walked out of the ED without distress, gait steady, resps easy. Concern for drug seeking behavior.     Final Clinical Impressions(s) / ED Diagnoses   Final diagnoses:  None    New Prescriptions New Prescriptions   No medications on file     Samuel JesterKathleen Onnie Alatorre, DO 04/12/16 1507

## 2016-04-10 NOTE — ED Notes (Signed)
Pt mother, Jay Keller, returned to ED to pick up pt d/c paperwork.

## 2016-04-10 NOTE — ED Triage Notes (Signed)
Pt states he slipped on a wet floor this morning and caught himself before he fell.  Pt c/o pain to left lower back, states he has had previous injury to same.

## 2016-04-10 NOTE — ED Notes (Signed)
Pt transported to xray 

## 2016-04-10 NOTE — ED Notes (Signed)
PT refused his medications and stated "this is bullshit that I can't have any pain medications." PT quickly got out of bed and walked out of the emergency department leaving his discharge paperwork on the bed.

## 2017-04-22 IMAGING — DX DG LUMBAR SPINE COMPLETE 4+V
5 series · 5 of 5 positions shown · non-contrast
Comparison: 01/17/2013

CLINICAL DATA: Slip and fall injury this morning. Pain to the left
lower back aggravated by movement. History of prior injury.

EXAM:
LUMBAR SPINE - COMPLETE 4+ VIEW

[l-spine ap]
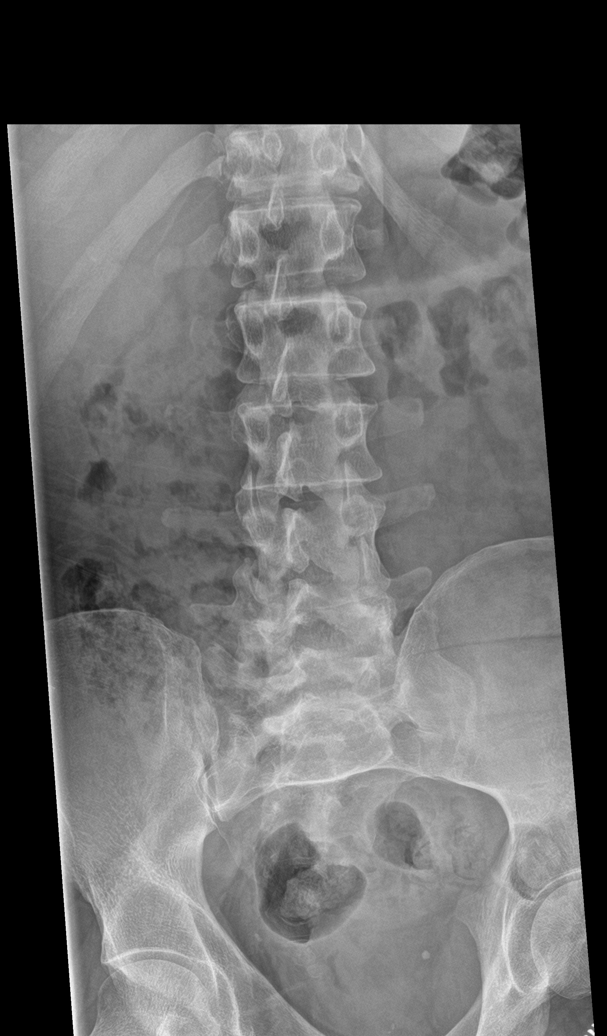

[l-spine obl (1 of 2)]
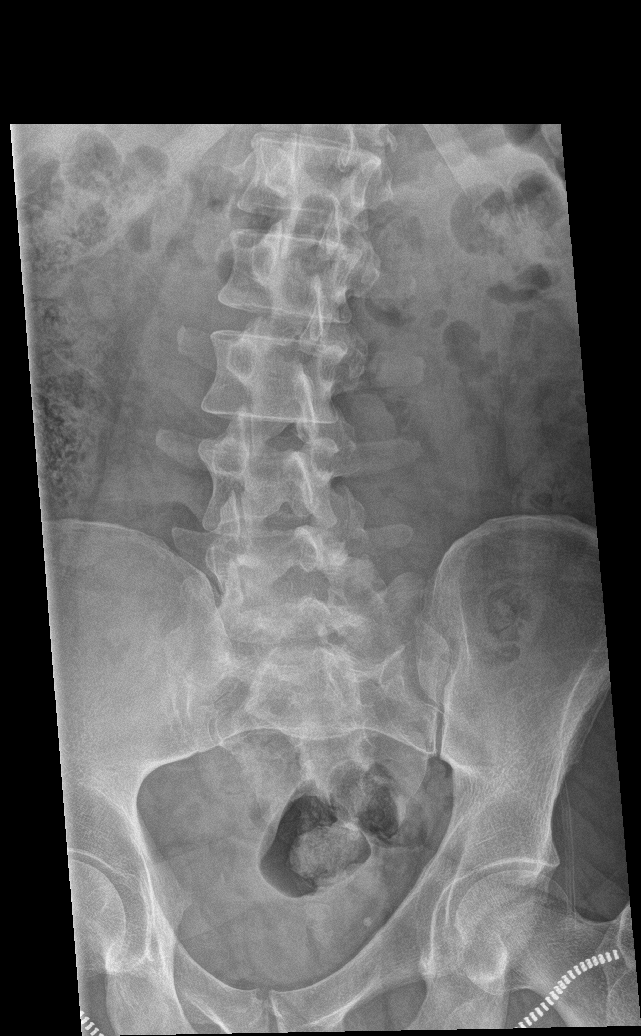

[l-spine obl (2 of 2)]
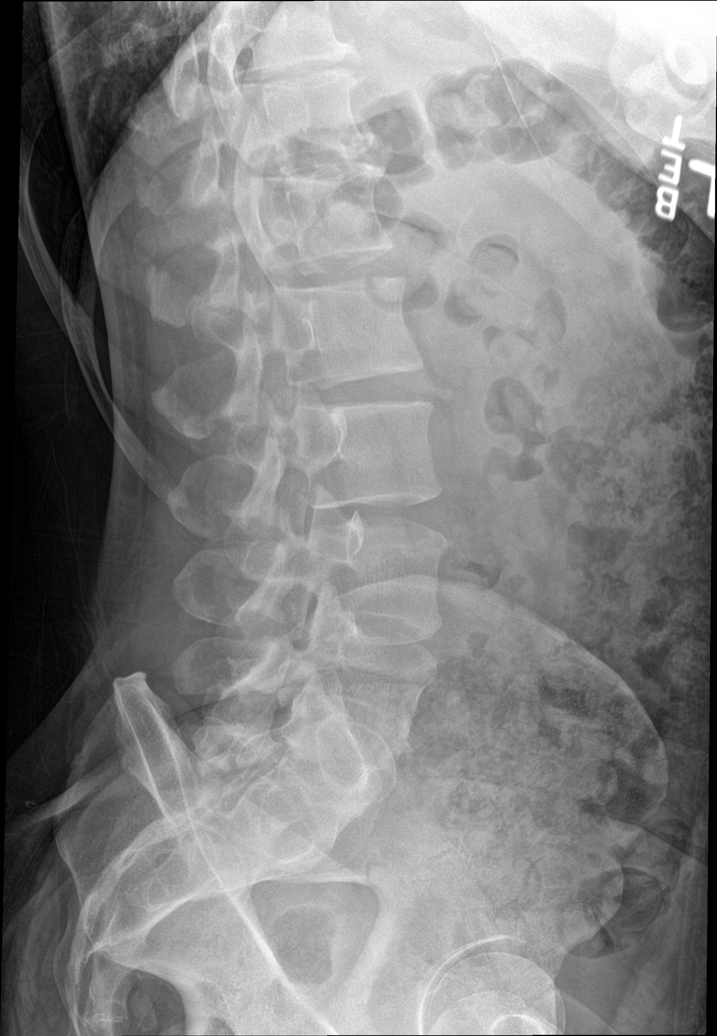

[l-spine lat]
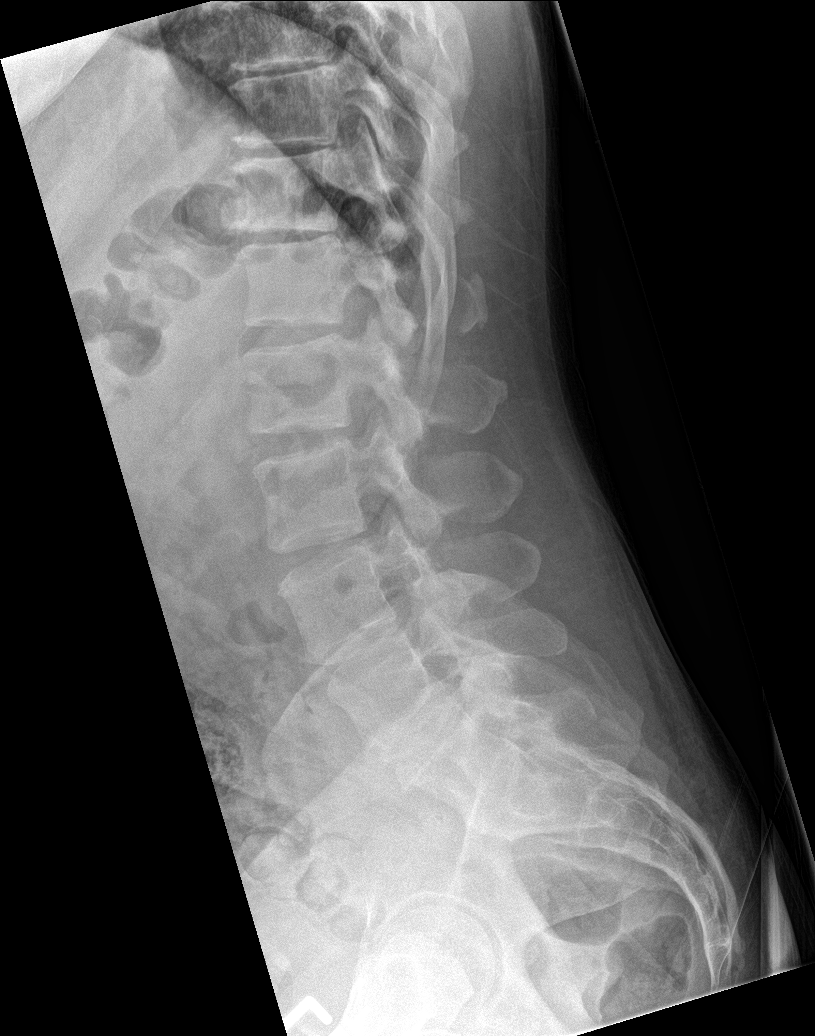

[l-spine spot]
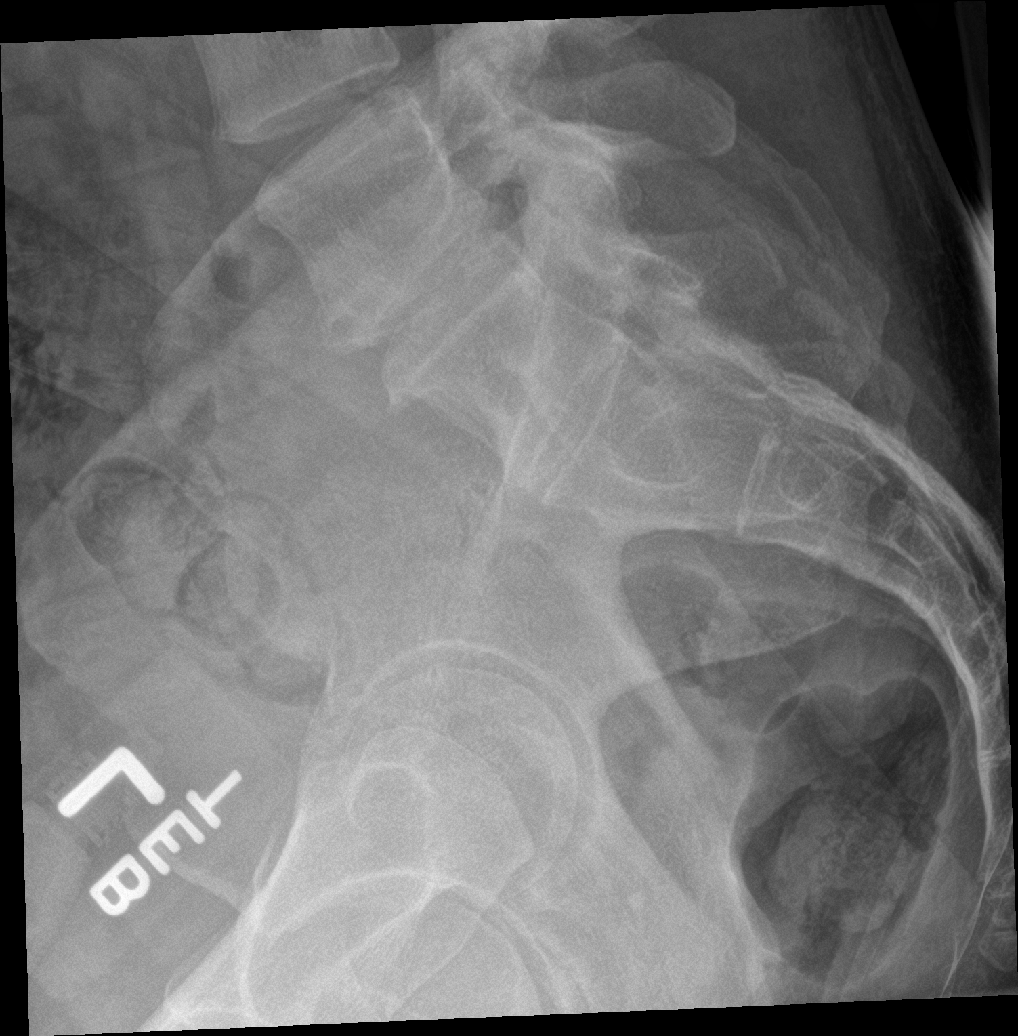

[5 of 5 positions shown; findings below may reference images not displayed]

FINDINGS: Normal alignment of the lumbar vertebrae. Degenerative changes with
narrowed interspaces and endplate hypertrophic changes. No vertebral
compression deformities. No focal bone lesion or bone destruction.
No significant change since prior study.
IMPRESSION: Degenerative changes in the lumbar spine. Normal alignment. No acute
bony abnormalities.

## 2019-02-02 ENCOUNTER — Ambulatory Visit (INDEPENDENT_AMBULATORY_CARE_PROVIDER_SITE_OTHER): Payer: Medicaid Other | Admitting: Infectious Diseases

## 2019-02-02 ENCOUNTER — Encounter: Payer: Self-pay | Admitting: Infectious Diseases

## 2019-02-02 ENCOUNTER — Telehealth: Payer: Self-pay | Admitting: Pharmacy Technician

## 2019-02-02 ENCOUNTER — Other Ambulatory Visit: Payer: Self-pay

## 2019-02-02 VITALS — BP 127/74 | HR 90 | Temp 98.0°F | Wt 137.0 lb

## 2019-02-02 DIAGNOSIS — B182 Chronic viral hepatitis C: Secondary | ICD-10-CM

## 2019-02-02 DIAGNOSIS — R7401 Elevation of levels of liver transaminase levels: Secondary | ICD-10-CM

## 2019-02-02 DIAGNOSIS — F1011 Alcohol abuse, in remission: Secondary | ICD-10-CM

## 2019-02-02 DIAGNOSIS — R740 Nonspecific elevation of levels of transaminase and lactic acid dehydrogenase [LDH]: Secondary | ICD-10-CM

## 2019-02-02 NOTE — Patient Instructions (Signed)
Remain sober from alcohol and drugs. Use condoms with all sexual encounters. Return to clinic in 3 weeks to review results and discuss HCV tx options.

## 2019-02-02 NOTE — Telephone Encounter (Signed)
[facility] Patient Teacher, English as a foreign language completed.    The patient has [place] Medicaid family planning that will not cover possible needed medications.  The patient will need to contact case manager to change the type of medicaid, or they will need patient assistance for medication.  We can complete the application and will need to meet with the patient for signatures and income documentation.  Jay Keller. Jay Keller Patient [facility] Phone: [phone] Fax:  [phone]

## 2019-02-02 NOTE — Progress Notes (Signed)
Jay Keller  [id number]  1976/05/23    HPI: The patient is a 43 y.o. y/o white male who presents today for an evaluation for HCV. He recently had labs performed in [facility] that confirmed a positive HCV Ab. He has had no follow up labs or prior evaluation for HCV in the past aside from a qualitative HCV VL that was positive in 10/19. He states he was initially diagnosed in 2017 while incarcerated but had no means to pursue further evaluation at that time. He admits to a history of IV drug use (cocaine) and having several tattoos placed. He has abstained from all illicit drugs since [id number] per his report. He denies ever receiving any body peircings or blood transfusions in the past. He states that his past girlfriend did have HCV. He do not consistently use condoms with sexual intercourse. He does not recall ever having a jaundicing illness in the past. He is w/o complaints today.  Past Medical History:  Diagnosis Date  . Chronic back pain   . DDD (degenerative disc disease), lumbar   . Hepatitis C    dx'ed in 2017  . Sciatica     No past surgical history on file.   No family history on file.   Social History   Tobacco Use  . Smoking status: Current Every Day Smoker    Packs/day: 1.00    Years: 25.00    Pack years: 25.00    Types: Cigarettes  . Smokeless tobacco: Never Used  Substance Use Topics  . Alcohol use: Yes    Comment: rarely, DWI x 5  . Drug use: Yes    Frequency: 7.0 times per week    Types: Marijuana, Methamphetamines, Cocaine      reports being sexually active.   No Known Allergies   Outpatient Medications Prior to Visit  Medication Sig Dispense Refill  . hydrOXYzine (ATARAX/VISTARIL) 25 MG tablet Take 1 tablet (25 mg total) by mouth every 6 (six) hours as needed for anxiety. 30 tablet 0  . methocarbamol (ROBAXIN) 500 MG tablet Take 2 tablets (1,000 mg total) by mouth 4 (four) times daily as needed for muscle spasms (muscle spasm/pain). 25  tablet 0  . naproxen (NAPROSYN) 250 MG tablet Take 1 tablet (250 mg total) by mouth 2 (two) times daily as needed for mild pain or moderate pain (take with food). 14 tablet 0  . nicotine (NICODERM CQ - DOSED IN MG/24 HOURS) 21 mg/24hr patch Place 1 patch (21 mg total) onto the skin daily. 28 patch 0  . traZODone (DESYREL) 100 MG tablet Take 1 tablet (100 mg total) by mouth at bedtime as needed for sleep. 14 tablet 0   No facility-administered medications prior to visit.      Review of Systems  Constitutional: Negative for chills, fatigue and fever.  HENT: Negative for congestion, hearing loss, rhinorrhea and sinus pressure.   Eyes: Negative for photophobia, pain, redness and visual disturbance.  Respiratory: Negative for apnea, cough, shortness of breath and wheezing.   Cardiovascular: Negative for chest pain and palpitations.  Gastrointestinal: Negative for abdominal pain, constipation, diarrhea, nausea and vomiting.  Endocrine: Negative for cold intolerance, heat intolerance, polydipsia and polyuria.  Genitourinary: Negative for decreased urine volume, dysuria, frequency, hematuria and testicular pain.  Musculoskeletal: Negative for back pain, myalgias and neck pain.  Skin: Negative for pallor and rash.  Allergic/Immunologic: Negative for immunocompromised state.  Neurological: Negative for dizziness, seizures, syncope, speech difficulty and light-headedness.  Hematological: Does not bruise/bleed  easily.  Psychiatric/Behavioral: Negative for agitation and hallucinations. The patient is not nervous/anxious.      Vitals:   02/02/19 1507  BP: 127/74  Pulse: 90  Temp: 98 F (36.7 C)     Physical Exam Gen: pleasant, NAD, A&Ox 3 Head: NCAT, no temporal wasting evident EENT: PERRL, EOMI, MMM, poor dentition Neck: supple, no JVD CV: NRRR, no murmurs evident Pulm: CTA bilaterally, no wheeze or retractions Abd: soft, NTND, +BS Extrems: no LE edema, 2+ pulses Skin: no rashes,  multiple tattoos noted, adequate skin turgor Neuro: CN II-XII grossly intact, no focal neurologic deficits appreciated, gait was WNL, A&Ox 3   Labs:  Lab Results  Component Value Date   WBC 8.9 12/10/2015   HGB 16.3 12/10/2015   HCT 47.0 12/10/2015   MCV 99.8 12/10/2015   PLT 412 (H) 12/10/2015    Assessment/Plan: The patient is a 43 y/o WM with h/o IVDU presenting for an evaluation for HCV.  1. HCV - HCV Ab was positive in 2017 initially and re-confirmed on labs in 10/19, thus confirming past exposure to pathogen. Note: this screening test will remain positive/reactive life-long even if HCV infection has been cured/immunologically cleared. Most likely risk factor for acquisition was past IV drug use, intranasal cocaine use, and less likely tattoo placement. Check quantitative HCV viral load and HCV genotype to assess for chronic infection. Check HIV Ab to exclude co-infection as well. As chronic infection is already suspected, will proceed with FibroSURE testing to determine stage of fibrosis and best directly active antiviral treatment options. F/u in 6 weeks to review lab results.   Health maintenance -. I have counselled the patient extensively re: the need for barrier precautions with sexual activity in order to prevent unwanted transmission of HCV. He must continue these practices until 3 months after treatment completion until a sustained virologic response (SVR) has been confirmed to establish a cure of her infection. He expressed full understanding of these instructions. Vaccination for hepatitis A & B was recommended as was abstinence from cigarettes.

## 2019-02-11 LAB — LIVER FIBROSIS, FIBROTEST-ACTITEST
ALT: 33 U/L (ref 9–46)
Alpha-2-Macroglobulin: 172 mg/dL (ref 106–279)
Apolipoprotein A1: 159 mg/dL (ref 94–176)
Bilirubin: 0.3 mg/dL (ref 0.2–1.2)
Fibrosis Score: 0.1
GGT: 37 U/L (ref 3–95)
Haptoglobin: 122 mg/dL (ref 43–212)
Necroinflammat ACT Score: 0.13
Reference ID: 2969863

## 2019-02-11 LAB — HIV ANTIBODY (ROUTINE TESTING W REFLEX): HIV 1&2 Ab, 4th Generation: NONREACTIVE

## 2019-02-11 LAB — COMPREHENSIVE METABOLIC PANEL
AG Ratio: 2.1 (calc) (ref 1.0–2.5)
ALT: 35 U/L (ref 9–46)
AST: 32 U/L (ref 10–40)
Albumin: 4.6 g/dL (ref 3.6–5.1)
Alkaline phosphatase (APISO): 58 U/L (ref 36–130)
BUN: 13 mg/dL (ref 7–25)
CO2: 27 mmol/L (ref 20–32)
Calcium: 9.5 mg/dL (ref 8.6–10.3)
Chloride: 104 mmol/L (ref 98–110)
Creat: 0.84 mg/dL (ref 0.60–1.35)
Globulin: 2.2 g/dL (calc) (ref 1.9–3.7)
Glucose, Bld: 86 mg/dL (ref 65–99)
Potassium: 4.2 mmol/L (ref 3.5–5.3)
Sodium: 137 mmol/L (ref 135–146)
Total Bilirubin: 0.4 mg/dL (ref 0.2–1.2)
Total Protein: 6.8 g/dL (ref 6.1–8.1)

## 2019-02-11 LAB — HEPATITIS C RNA QUANTITATIVE
HCV Quantitative Log: 6.17 Log IU/mL — ABNORMAL HIGH
HCV RNA, PCR, QN: 1480000 IU/mL — ABNORMAL HIGH

## 2019-02-11 LAB — HEPATITIS B SURFACE ANTIGEN: Hepatitis B Surface Ag: NONREACTIVE

## 2019-02-11 LAB — HEPATITIS B SURFACE ANTIBODY, QUANTITATIVE: Hepatitis B-Post: 425 m[IU]/mL (ref >=10)

## 2019-02-11 LAB — HEPATITIS C GENOTYPE

## 2019-02-22 ENCOUNTER — Telehealth: Payer: Self-pay | Admitting: Infectious Diseases

## 2019-02-22 NOTE — Telephone Encounter (Signed)
COVID-19 Pre-Screening Questions: ° °Do you currently have a fever (>100 °F), chills or unexplained body aches? No  ° °Are you currently experiencing new cough, shortness of breath, sore throat, runny nose? No  °•  °Have you recently travelled outside the state of [place] in the last 14 days? No  °•  °1. Have you been in contact with someone that is currently pending confirmation of Covid19 testing or has been confirmed to have the Covid19 virus?  No  ° °

## 2019-02-23 ENCOUNTER — Encounter: Payer: Self-pay | Admitting: Infectious Diseases

## 2019-02-23 ENCOUNTER — Other Ambulatory Visit: Payer: Self-pay

## 2019-02-23 ENCOUNTER — Ambulatory Visit (INDEPENDENT_AMBULATORY_CARE_PROVIDER_SITE_OTHER): Payer: Self-pay | Admitting: Infectious Diseases

## 2019-02-23 ENCOUNTER — Telehealth: Payer: Self-pay | Admitting: Pharmacy Technician

## 2019-02-23 VITALS — BP 120/72 | HR 60 | Ht 63.0 in | Wt 131.0 lb

## 2019-02-23 DIAGNOSIS — B182 Chronic viral hepatitis C: Secondary | ICD-10-CM

## 2019-02-23 DIAGNOSIS — F1011 Alcohol abuse, in remission: Secondary | ICD-10-CM

## 2019-02-23 MED ORDER — LEDIPASVIR-SOFOSBUVIR 90-400 MG PO TABS: 1.0000 | ORAL_TABLET | Freq: Every day | ORAL | 1 refills | Status: AC

## 2019-02-23 NOTE — Telephone Encounter (Signed)
[facility] Patient Advocate Encounter  Met with patient today after the appointment with Dr. Prince Rome. Patient confirmed that he is uninsured and supported by the income of his parents. Application for [employer] was filled out, signed and submitted today. Informed patient that we will call him once we receive notification from the manufacturer of approval or denial. He knows to call us once he receives the medication so that we can schedule his 4 week follow-up appointment. He expressed understanding of the process and was ready to start medication treatment.

## 2019-02-23 NOTE — Patient Instructions (Signed)
Return to clinic exactly 4 weeks from time of starting harvoni for lab work. Return to clinic in 6 weeks for appointment with Dr. Prince Rome. Continue to abstain from illicit drugs.

## 2019-02-23 NOTE — Progress Notes (Signed)
Jay Keller  [id number]  [birth year]    HPI: The patient is a 43 y.o. y/o white male who presents today for an evaluation for HCV. He was last seen in the clinic on 02/02/2019 at which time his HIV Ab and FibroSURE testing were performed. He has confirmed chronic infection with genotype 1a and HCV viremia of 1.48 million IU. He denies any active IVDU at present and last used illicit drugs was over 6 months ago. He denies any acute complaints today but is anxious to begin DAA tx for his HCV.  Past Medical History:  Diagnosis Date  . Chronic back pain   . DDD (degenerative disc disease), lumbar   . Hepatitis C    dx'ed in 2017  . Sciatica     No past surgical history on file.   No family history on file. Reviewed and no significant family illnesses reported.  Social History   Tobacco Use  . Smoking status: Current Every Day Smoker    Packs/day: 1.00    Years: 25.00    Pack years: 25.00    Types: Cigarettes  . Smokeless tobacco: Never Used  Substance Use Topics  . Alcohol use: Yes    Comment: rarely, DWI x 5  . Drug use: Yes    Frequency: 7.0 times per week    Types: Marijuana, Cocaine    Comment: IV & inhaled cocaine, last use in 2012      reports being sexually active. +condom use with sexual encounters  No Known Allergies   Outpatient Medications Prior to Visit  Medication Sig Dispense Refill  . hydrOXYzine (ATARAX/VISTARIL) 25 MG tablet Take 1 tablet (25 mg total) by mouth every 6 (six) hours as needed for anxiety. (Patient not taking: Reported on 02/02/2019) 30 tablet 0  . methocarbamol (ROBAXIN) 500 MG tablet Take 2 tablets (1,000 mg total) by mouth 4 (four) times daily as needed for muscle spasms (muscle spasm/pain). (Patient not taking: Reported on 02/02/2019) 25 tablet 0  . naproxen (NAPROSYN) 250 MG tablet Take 1 tablet (250 mg total) by mouth 2 (two) times daily as needed for mild pain or moderate pain (take with food). (Patient not taking: Reported on  02/02/2019) 14 tablet 0  . nicotine (NICODERM CQ - DOSED IN MG/24 HOURS) 21 mg/24hr patch Place 1 patch (21 mg total) onto the skin daily. (Patient not taking: Reported on 02/02/2019) 28 patch 0  . traZODone (DESYREL) 100 MG tablet Take 1 tablet (100 mg total) by mouth at bedtime as needed for sleep. (Patient not taking: Reported on 02/02/2019) 14 tablet 0   No facility-administered medications prior to visit.      Review of Systems  Constitutional: Negative for chills, fatigue and fever.  HENT: Negative for congestion, hearing loss, rhinorrhea and sinus pressure.   Eyes: Negative for photophobia, pain, redness and visual disturbance.  Respiratory: Negative for apnea, cough, shortness of breath and wheezing.   Cardiovascular: Negative for chest pain and palpitations.  Gastrointestinal: Negative for abdominal pain, constipation, diarrhea, nausea and vomiting.  Endocrine: Negative for cold intolerance, heat intolerance, polydipsia and polyuria.  Genitourinary: Negative for decreased urine volume, dysuria, frequency, hematuria and testicular pain.  Musculoskeletal: Negative for back pain, myalgias and neck pain.  Skin: Negative for pallor and rash.  Allergic/Immunologic: Negative for immunocompromised state.  Neurological: Negative for dizziness, seizures, syncope, speech difficulty and light-headedness.  Hematological: Does not bruise/bleed easily.  Psychiatric/Behavioral: Negative for agitation and hallucinations. The patient is not nervous/anxious.  Vitals:   02/23/19 1012  BP: 120/72  Pulse: 60     Physical Exam Gen: pleasant, NAD, A&Ox 3 Head: NCAT, no temporal wasting evident EENT: PERRL, EOMI, MMM, adequate dentition Neck: supple, no JVD CV: NRRR, no murmurs evident Pulm: CTA bilaterally, no wheeze or retractions Abd: soft, NTND, +BS Extrems: no LE edema, 2+ pulses Skin: no rashes, adequate skin turgor Neuro: CN II-XII grossly intact, no focal neurologic deficits  appreciated, gait was WNL, A&Ox 3   Labs: Lab Results  Component Value Date   HEPBSAG NON-REACTIVE 02/02/2019    Lab Results  Component Value Date   HCVRNAPCRQN 1,480,000 (H) 02/02/2019    Lab Results  Component Value Date   FIBROSTAGE F0 02/02/2019    Lab Results  Component Value Date   HCVGENOTYPE 1a 02/02/2019    Lab Results  Component Value Date   WBC 8.9 12/10/2015   HGB 16.3 12/10/2015   HCT 47.0 12/10/2015   MCV 99.8 12/10/2015   PLT 412 (H) 12/10/2015       Chemistry      Component Value Date/Time   NA 137 02/02/2019 1545   K 4.2 02/02/2019 1545   CL 104 02/02/2019 1545   CO2 27 02/02/2019 1545   BUN 13 02/02/2019 1545   CREATININE 0.84 02/02/2019 1545      Component Value Date/Time   CALCIUM 9.5 02/02/2019 1545   ALKPHOS 133 (H) 12/10/2015 1928   AST 32 02/02/2019 1545   ALT 35 02/02/2019 1545   ALT 33 02/02/2019 1545   BILITOT 0.4 02/02/2019 1545        Assessment/Plan: The patient is a 43 y/o WM with h/o IVDU presenting for chronic HCV.  HCV - HCV Ab was positive in 2017, thus confirming past exposure to pathogen. Note: this screening test will remain positive/reactive life-long even if HCV infection has been cured/immunologically cleared. Most likely risk factor for acquisition was past IV drug use and intranasal cocaine use, less likely tattoo placement. Chronic infection with HCV genotype 1a and HCV viral load of 1.48 million IU. HIV Ab was negative, thus excluding co-infection. As chronic infection is now confirmed, FibroSURE testing was performed showing F0 stage of fibrosis. DAA options were reviewed with the patient in detail, and he has opted to initiate DAA treatment with harvoni for 8 weeks in total. Side effect profile, including 15-18% risk of HA.  F/u in 6 weeks to review lab results.   Health maintenance - I have counselled the patient extensively re: the need for barrier precautions with sexual activity in order to prevent unwanted  transmission of HCV. He must continue these practices until 3 months after treatment completion until a sustained virologic response (SVR) has been confirmed to establish a cure of her infection. He expressed full understanding of these instructions. Vaccination for hepatitis A & B was recommended as was abstinence from cigarettes.

## 2019-02-24 NOTE — Telephone Encounter (Signed)
Patient has been approved to receive free Harvoni tablets through [employer] program. Enrollment period is 02/24/2019 to 04/21/2019. Patient has been contacted and will hear from [employer] within a week to set up first shipment of the medication. Patient will call us when the medication arrives to his home.

## 2019-03-10 ENCOUNTER — Encounter: Payer: Self-pay | Admitting: Pharmacy Technician

## 2019-03-10 NOTE — Telephone Encounter (Signed)
Patient received the medication on 03/03/2019 and began treatment that day.

## 2019-03-23 ENCOUNTER — Other Ambulatory Visit: Payer: Self-pay

## 2019-03-23 ENCOUNTER — Emergency Department (HOSPITAL_COMMUNITY)
Admission: EM | Admit: 2019-03-23 | Discharge: 2019-03-23 | Disposition: A | Discharge status: Home / Self Care | Payer: Medicaid Other | Attending: Emergency Medicine | Admitting: Emergency Medicine

## 2019-03-23 ENCOUNTER — Encounter (HOSPITAL_COMMUNITY): Payer: Self-pay | Admitting: *Deleted

## 2019-03-23 DIAGNOSIS — F17210 Nicotine dependence, cigarettes, uncomplicated: Secondary | ICD-10-CM | POA: Insufficient documentation

## 2019-03-23 DIAGNOSIS — F1110 Opioid abuse, uncomplicated: Secondary | ICD-10-CM | POA: Insufficient documentation

## 2019-03-23 DIAGNOSIS — F1910 Other psychoactive substance abuse, uncomplicated: Secondary | ICD-10-CM

## 2019-03-23 DIAGNOSIS — F1410 Cocaine abuse, uncomplicated: Secondary | ICD-10-CM | POA: Insufficient documentation

## 2019-03-23 NOTE — ED Triage Notes (Signed)
Pt admits to using heroin today, drowsy in triage. Pt asleep on arrival and family found him passed out on the steps. Pt denies any SI or HI. Pt denies wanting detox. Admits to using meth as well along with ETOH.

## 2019-03-24 NOTE — ED Provider Notes (Signed)
[facility] EMERGENCY DEPARTMENT Provider Note   CSN: [id number] Arrival date & time: 03/23/19  1806     History   Chief Complaint Chief Complaint  Patient presents with  . Drug Overdose    HPI Jay Keller is a 43 y.o. male.     HPI   43 year old male with polysubstance abuse.  Found by family very drowsy and felt that he needed to be evaluated.  Patient admits to using multiple drugs.  "Heroin, cocaine, meth." Longstanding history of substance abuse.  He denies that he has been trying to harm himself.  Denies any any hallucinations or wanting harm anyone else.  He is not interested in seeking help at this time.  Past Medical History:  Diagnosis Date  . Chronic back pain   . DDD (degenerative disc disease), lumbar   . Hepatitis C    dx'ed in 2017  . Sciatica     Patient Active Problem List   Diagnosis Date Noted  . GAD (generalized anxiety disorder) 12/12/2015  . Alcohol use disorder, severe, dependence ([place]) 12/11/2015  . Ankle fracture, lateral malleolus, closed 03/12/2011  . Ankle fracture 01/16/2011    No past surgical history on file.      Home Medications    Prior to Admission medications   Medication Sig Start Date End Date Taking? Authorizing Provider  hydrOXYzine (ATARAX/VISTARIL) 25 MG tablet Take 1 tablet (25 mg total) by mouth every 6 (six) hours as needed for anxiety. Patient not taking: Reported on 02/02/2019 12/12/15   Withrow, Elyse Jarvis, FNP  Ledipasvir-Sofosbuvir (HARVONI) 90-400 MG TABS Take 1 tablet by mouth daily. 02/23/19   Powers, Evern Core, MD  methocarbamol (ROBAXIN) 500 MG tablet Take 2 tablets (1,000 mg total) by mouth 4 (four) times daily as needed for muscle spasms (muscle spasm/pain). Patient not taking: Reported on 02/02/2019 04/10/16   Francine Graven, DO  naproxen (NAPROSYN) 250 MG tablet Take 1 tablet (250 mg total) by mouth 2 (two) times daily as needed for mild pain or moderate pain (take with food). Patient not taking: Reported  on 02/02/2019 04/10/16   Francine Graven, DO  nicotine (NICODERM CQ - DOSED IN MG/24 HOURS) 21 mg/24hr patch Place 1 patch (21 mg total) onto the skin daily. Patient not taking: Reported on 02/02/2019 12/12/15   Benjamine Mola, FNP  traZODone (DESYREL) 100 MG tablet Take 1 tablet (100 mg total) by mouth at bedtime as needed for sleep. Patient not taking: Reported on 02/02/2019 12/12/15   Benjamine Mola, FNP    Family History No family history on file.  Social History Social History   Tobacco Use  . Smoking status: Current Every Day Smoker    Packs/day: 1.00    Years: 25.00    Pack years: 25.00    Types: Cigarettes  . Smokeless tobacco: Never Used  Substance Use Topics  . Alcohol use: Yes    Comment: rarely, DWI x 5  . Drug use: Yes    Frequency: 7.0 times per week    Types: Marijuana, Methamphetamines, Cocaine     Allergies   Patient has no known allergies.   Review of Systems Review of Systems All systems reviewed and negative, other than as noted in HPI.   Physical Exam Updated Vital Signs BP 113/82 (BP Location: Right Arm)   Pulse (!) 103   Temp 98.4 F (36.9 C) (Oral)   Resp 15   Ht 5\' 3"  (1.6 m)   Wt 59.4 kg   SpO2 93%  BMI 23.21 kg/m   Physical Exam Vitals signs and nursing note reviewed.  Constitutional:      General: He is not in acute distress.    Appearance: He is well-developed.     Comments: Very drowsy.  Will fall asleep when not actively engaged.  Able to hold a cup of ice water and drink from without apparent difficulty.  HENT:     Head: Normocephalic and atraumatic.  Eyes:     General:        Right eye: No discharge.        Left eye: No discharge.     Conjunctiva/sclera: Conjunctivae normal.  Neck:     Musculoskeletal: Neck supple.  Cardiovascular:     Rate and Rhythm: Normal rate and regular rhythm.     Heart sounds: Normal heart sounds. No murmur. No friction rub. No gallop.   Pulmonary:     Effort: Pulmonary effort is normal. No  respiratory distress.     Breath sounds: Normal breath sounds.  Abdominal:     General: There is no distension.     Palpations: Abdomen is soft.     Tenderness: There is no abdominal tenderness.  Musculoskeletal:        General: No tenderness.  Skin:    General: Skin is warm and dry.  Neurological:     General: No focal deficit present.     Mental Status: He is alert and oriented to person, place, and time.  Psychiatric:        Thought Content: Thought content normal.      ED Treatments / Results  Labs (all labs ordered are listed, but only abnormal results are displayed) Labs Reviewed - No data to display  EKG None  Radiology No results found.  Procedures Procedures (including critical care time)  Medications Ordered in ED Medications - No data to display   Initial Impression / Assessment and Plan / ED Course  I have reviewed the triage vital signs and the nursing notes.  Pertinent labs & imaging results that were available during my care of the patient were reviewed by me and considered in my medical decision making (see chart for details).        43 year old male with polysubstance abuse.  He has no acute complaints.  Denies suicidal homicidal ideation.  He is not interested in getting any help for substance abuse at this time.  He was provided with a list of resources if he should change his mind.  Encouraged to hold onto it. Final Clinical Impressions(s) / ED Diagnoses   Final diagnoses:  Polysubstance abuse [facility](HCC)    ED Discharge Orders    None       Raeford RazorKohut, Breiana Stratmann, MD 03/24/19 1015

## 2019-03-31 ENCOUNTER — Ambulatory Visit: Payer: Medicaid Other | Admitting: Infectious Diseases

## 2019-04-06 ENCOUNTER — Ambulatory Visit: Payer: Medicaid Other | Admitting: Infectious Diseases

## 2019-04-14 ENCOUNTER — Encounter: Payer: Self-pay | Admitting: Infectious Diseases

## 2019-04-14 ENCOUNTER — Other Ambulatory Visit: Payer: Self-pay

## 2019-04-14 ENCOUNTER — Ambulatory Visit (INDEPENDENT_AMBULATORY_CARE_PROVIDER_SITE_OTHER): Payer: Self-pay | Admitting: Infectious Diseases

## 2019-04-14 VITALS — BP 153/83 | HR 78 | Temp 98.0°F

## 2019-04-14 DIAGNOSIS — F1011 Alcohol abuse, in remission: Secondary | ICD-10-CM

## 2019-04-14 DIAGNOSIS — B182 Chronic viral hepatitis C: Secondary | ICD-10-CM

## 2019-04-14 NOTE — Progress Notes (Signed)
Jay Keller  [id number]  07/16/1976    HPI: The patient is a 43 y.o. y/o white male who presents today for a routine return visit for chronic HCV. He was last seen in the clinic on 02/23/2019. Following his last visit, he began harvoni on 03/03/2019 per pharmacy records. His HIV Ab was negative and FibroSURE testing showed F0 fibrosis. He has confirmed chronic infection with genotype 1a and HCV viremia of 1.48 million IU. He denies any active IVDU at present and last used illicit drugs was over 8 months ago. He does report intermittent HA since starting harvoni, but he has missed no doses as a result of the side effect.  He continues to abstain from alcohol and illicit drugs.  He is otherwise without acute complaints today.  Past Medical History:  Diagnosis Date  . Chronic back pain   . DDD (degenerative disc disease), lumbar   . Hepatitis C    dx'ed in 2017  . Sciatica     No past surgical history on file.   No family history on file. Reviewed and no significant family illnesses reported.  Social History   Tobacco Use  . Smoking status: Current Every Day Smoker    Packs/day: 1.00    Years: 25.00    Pack years: 25.00    Types: Cigarettes  . Smokeless tobacco: Never Used  Substance Use Topics  . Alcohol use: Yes    Comment: rarely, DWI x 5  . Drug use: Yes    Frequency: 7.0 times per week    Types: Marijuana, Methamphetamines, Cocaine      reports being sexually active. +condom use with sexual encounters  No Known Allergies   Outpatient Medications Prior to Visit  Medication Sig Dispense Refill  . hydrOXYzine (ATARAX/VISTARIL) 25 MG tablet Take 1 tablet (25 mg total) by mouth every 6 (six) hours as needed for anxiety. 30 tablet 0  . Ledipasvir-Sofosbuvir (HARVONI) 90-400 MG TABS Take 1 tablet by mouth daily. 28 tablet 1  . methocarbamol (ROBAXIN) 500 MG tablet Take 2 tablets (1,000 mg total) by mouth 4 (four) times daily as needed for muscle spasms (muscle  spasm/pain). 25 tablet 0  . naproxen (NAPROSYN) 250 MG tablet Take 1 tablet (250 mg total) by mouth 2 (two) times daily as needed for mild pain or moderate pain (take with food). 14 tablet 0  . nicotine (NICODERM CQ - DOSED IN MG/24 HOURS) 21 mg/24hr patch Place 1 patch (21 mg total) onto the skin daily. 28 patch 0  . traZODone (DESYREL) 100 MG tablet Take 1 tablet (100 mg total) by mouth at bedtime as needed for sleep. 14 tablet 0   No facility-administered medications prior to visit.      Review of Systems  Constitutional: Positive for fatigue. Negative for chills and fever.  HENT: Negative for congestion, hearing loss, rhinorrhea and sinus pressure.   Eyes: Negative for photophobia, pain, redness and visual disturbance.  Respiratory: Negative for apnea, cough, shortness of breath and wheezing.   Cardiovascular: Negative for chest pain and palpitations.  Gastrointestinal: Negative for abdominal pain, constipation, diarrhea, nausea and vomiting.  Endocrine: Negative for cold intolerance, heat intolerance, polydipsia and polyuria.  Genitourinary: Negative for decreased urine volume, dysuria, frequency, hematuria and testicular pain.  Musculoskeletal: Negative for back pain, myalgias and neck pain.  Skin: Negative for pallor and rash.  Allergic/Immunologic: Negative for immunocompromised state.  Neurological: Positive for headaches. Negative for dizziness, seizures, syncope, speech difficulty and light-headedness.  Hematological: Does not  bruise/bleed easily.  Psychiatric/Behavioral: Negative for agitation and hallucinations. The patient is not nervous/anxious.      Vitals:   04/14/19 0954  BP: (!) 153/83  Pulse: 78  Temp: 98 F (36.7 C)     Physical Exam Gen: pleasant, NAD, A&Ox 3 Head: NCAT, no temporal wasting evident EENT: PERRL, EOMI, MMM, adequate dentition Neck: supple, no JVD CV: NRRR, no murmurs evident Pulm: CTA bilaterally, no wheeze or retractions Abd: soft, NTND,  +BS Extrems: no LE edema, 2+ pulses Skin: no rashes, occasional scratches to both UEs (healing), adequate skin turgor, poor nail hygeine Neuro: CN II-XII grossly intact, no focal neurologic deficits appreciated, gait was WNL, A&Ox 3   Labs: Lab Results  Component Value Date   HEPBSAG NON-REACTIVE 02/02/2019    Lab Results  Component Value Date   HCVRNAPCRQN 1,480,000 (H) 02/02/2019    Lab Results  Component Value Date   FIBROSTAGE F0 02/02/2019    Lab Results  Component Value Date   HCVGENOTYPE 1a 02/02/2019    Lab Results  Component Value Date   WBC 8.9 12/10/2015   HGB 16.3 12/10/2015   HCT 47.0 12/10/2015   MCV 99.8 12/10/2015   PLT 412 (H) 12/10/2015       Chemistry      Component Value Date/Time   NA 137 02/02/2019 1545   K 4.2 02/02/2019 1545   CL 104 02/02/2019 1545   CO2 27 02/02/2019 1545   BUN 13 02/02/2019 1545   CREATININE 0.84 02/02/2019 1545      Component Value Date/Time   CALCIUM 9.5 02/02/2019 1545   ALKPHOS 133 (H) 12/10/2015 1928   AST 32 02/02/2019 1545   ALT 35 02/02/2019 1545   ALT 33 02/02/2019 1545   BILITOT 0.4 02/02/2019 1545        Assessment/Plan: HCV - HCV Ab was positive in 2017, thus confirming past exposure to pathogen. Note: this screening test will remain positive/reactive life-long even if HCV infection has been cured/immunologically cleared. Most likely risk factor for acquisition was past IV drug use and intranasal cocaine use, less likely tattoo placement. Chronic infection with HCV genotype 1a and HCV viral load of 1.48 million IU. HIV Ab was negative, thus excluding co-infection. As chronic infection is now confirmed, FibroSURE testing was performed showing F0 stage of fibrosis.  He has thus far tolerated Harvoni well for the past 6 weeks.  We will repeat HCV viral load today to assess response to treatment.  As the patient's labs are now slightly askew in terms of timing, we will have him repeat his hepatitis C viral  load in 2 weeks for end-of-treatment viremia assessment with a plan to return to clinic in 4 weeks to see me.  Unfortunately I am unavailable prior to that time due to scheduling.  Health maintenance - I have counselled the patient extensively re: the need for barrier precautions with sexual activity to prevent sexual transmission of his infection. He must continue these practices until 3 months after treatment completion until a sustained virologic response (SVR) has been confirmed to establish a cure of her infection. He expressed full understanding of these instructions. Vaccination for hepatitis A & B was recommended as was abstinence from cigarettes, alcohol, and illicit drugs.

## 2019-04-14 NOTE — Addendum Note (Signed)
Addended by: Janine Ores on: 04/14/2019 10:17 AM   Modules accepted: Orders

## 2019-04-14 NOTE — Patient Instructions (Addendum)
Return to clinic in 2 weeks for repeat HCV labs. Take harvoni 1 tab daily until current course complete. Return to clinic in 4-5 weeks for appointment with Dr. Prince Rome. Abstain from alcohol and illicit drugs.

## 2019-04-18 LAB — HEPATITIS C RNA QUANTITATIVE
HCV Quantitative Log: <1.18 Log IU/mL
HCV RNA, PCR, QN: <15 IU/mL

## 2019-04-28 ENCOUNTER — Other Ambulatory Visit: Payer: Self-pay

## 2019-04-28 DIAGNOSIS — B182 Chronic viral hepatitis C: Secondary | ICD-10-CM

## 2019-05-01 LAB — HEPATITIS C RNA QUANTITATIVE
HCV Quantitative Log: <1.18 Log IU/mL
HCV RNA, PCR, QN: <15 IU/mL

## 2019-05-13 ENCOUNTER — Ambulatory Visit: Payer: Medicaid Other | Admitting: Infectious Diseases

## 2019-05-19 ENCOUNTER — Other Ambulatory Visit: Payer: Self-pay

## 2019-05-19 ENCOUNTER — Encounter: Payer: Self-pay | Admitting: Infectious Diseases

## 2019-05-19 ENCOUNTER — Ambulatory Visit (INDEPENDENT_AMBULATORY_CARE_PROVIDER_SITE_OTHER): Payer: Self-pay | Admitting: Infectious Diseases

## 2019-05-19 DIAGNOSIS — F1011 Alcohol abuse, in remission: Secondary | ICD-10-CM

## 2019-05-19 DIAGNOSIS — B182 Chronic viral hepatitis C: Secondary | ICD-10-CM

## 2019-05-19 NOTE — Progress Notes (Signed)
Virtual Visit via Telephone Note  I connected with Jay Keller on 05/19/19 at  8:45 AM EDT by telephone and verified that I am speaking with the correct person using two identifiers.  Location: Patient: [place], [place] (pt's personal residence) Provider: [facility]   I discussed the limitations, risks, security and privacy concerns of performing an evaluation and management service by telephone and the availability of in person appointments. I also discussed with the patient that there may be a patient responsible charge related to this service. The patient expressed understanding and agreed to proceed.   History of Present Illness: The patient is a 43 y.o. y/o white male who presents today for a routine return visit for chronic HCV. He was last seen in the clinic on 04/14/2019. Following his last visit, he completed his 8 week harvoni course. His HIV Ab was negative and FibroSURE testing showed F0 fibrosis. Earlier this year, he had confirmed chronic infection with genotype 1a and HCV viremia of 1.48 million IU. He denies any active IVDU at present and last used illicit drugs was over 9 months ago. He did experience intermittent HAs while taking harvoni, but this did not affect his compliance. His HAs have now resolved as he has been off DAA tx for the past couple of weeks.  He continues to abstain from alcohol and illicit drugs.  He is otherwise without acute complaints today. He recently relocated to [place], [place] as part of a work/recovery program that he elected to pursue to reduce his risk for relapse of his substance abuse. His HCV VL was undetectable at both his 4 week and end-of-treatment (8 week) intervals. He has no acute complaints today.      Past Medical History:  Diagnosis Date  . Chronic back pain   . DDD (degenerative disc disease), lumbar   . Hepatitis C    dx'ed in 2017  . Sciatica     No past surgical history on file.   No family history on file. Reviewed and no  significant family illnesses reported.  Social History        Tobacco Use  . Smoking status: Current Every Day Smoker    Packs/day: 1.00    Years: 25.00    Pack years: 25.00    Types: Cigarettes  . Smokeless tobacco: Never Used  Substance Use Topics  . Alcohol use: Yes    Comment: rarely, DWI x 5  . Drug use: Yes    Frequency: 7.0 times per week    Types: Marijuana, Methamphetamines, Cocaine    Correction: Re: drug use, patient has only past use of illicit drugs for the past 9 months. Please disregard comments that he is still actively using 7 days/wk.  reports being sexually active. +condom use with sexual encounters  No Known Allergies     Observations/Objective: Pt is pleasant and non-dyspneic on the phone. A&O x 3. He has no physical complaints on the phone.  Assessment and Plan: The patient is a 43 year old white male with a history of IV drug use presenting for chronic hepatitis C.  HCV - HCV Ab was positive in 2017, thus confirming past exposure to pathogen. Note: this screening test will remain positive/reactive life-long even if HCV infection has been cured/immunologically cleared. Most likely risk factor for acquisition was past IV drug use and intranasal cocaine use, less likely tattoo placement. Chronic infection with HCV genotype 1a and HCV viral load of 1.48 million IU. HIV Ab was negative, thus excluding co-infection. As chronic  infection was confirmed, FibroSURE testing was performed showing F0 stage of fibrosis.  He tolerated his 8 week harvoni tx well w/o event. His 4 week and end-of-treatment hepatitis C viral load assays were both undetectable. Will repeat his HCV viral load in 12 weeks to assess for an SVR (sustained virologic response)/cure testing.  Health maintenance - I have counselled the patient extensively re: the need for barrier precautions with sexual activity to prevent sexual transmission of his infection. He must continue these  practices until 3 months after treatment completion until a sustained virologic response (SVR) has been confirmed to establish a cure of her infection. He expressed full understanding of these instructions. Vaccination for hepatitis A & B was recommended as was abstinence from cigarettes, alcohol, and illicit drugs.   Follow Up Instructions: Call our office near Christmast[place] to arrange final HCV VL lab draw (to be drawn in early January). Continue to abstain from alcohol and illicit drugs.   I discussed the assessment and treatment plan with the patient. The patient was provided an opportunity to ask questions and all were answered. The patient agreed with the plan and demonstrated an understanding of the instructions.   The patient was advised to call back or seek an in-person evaluation if the symptoms worsen or if the condition fails to improve as anticipated.  I provided 8 minutes of non-face-to-face time during this encounter.   Janine Ores, MD

## 2019-05-19 NOTE — Patient Instructions (Signed)
Call our office near Christmast[place] to arrange final HCV VL lab draw (to be drawn in early January). Continue to abstain from alcohol and illicit drugs.

## 2021-07-25 ENCOUNTER — Emergency Department (HOSPITAL_BASED_OUTPATIENT_CLINIC_OR_DEPARTMENT_OTHER)
Admission: EM | Admit: 2021-07-25 | Discharge: 2021-07-25 | Disposition: A | Discharge status: Home / Self Care | Payer: BLUE CROSS/BLUE SHIELD | Attending: Emergency Medicine | Admitting: Emergency Medicine

## 2021-07-25 ENCOUNTER — Other Ambulatory Visit: Payer: Self-pay

## 2021-07-25 ENCOUNTER — Emergency Department (HOSPITAL_BASED_OUTPATIENT_CLINIC_OR_DEPARTMENT_OTHER): Payer: BLUE CROSS/BLUE SHIELD | Admitting: Radiology

## 2021-07-25 ENCOUNTER — Encounter (HOSPITAL_BASED_OUTPATIENT_CLINIC_OR_DEPARTMENT_OTHER): Payer: Self-pay | Admitting: Emergency Medicine

## 2021-07-25 DIAGNOSIS — M545 Low back pain, unspecified: Secondary | ICD-10-CM | POA: Insufficient documentation

## 2021-07-25 DIAGNOSIS — F17210 Nicotine dependence, cigarettes, uncomplicated: Secondary | ICD-10-CM | POA: Insufficient documentation

## 2021-07-25 MED ORDER — METHOCARBAMOL 500 MG PO TABS: 500.0000 mg | ORAL_TABLET | Freq: Two times a day (BID) | ORAL | 0 refills | Status: AC

## 2021-07-25 MED ORDER — MELOXICAM 7.5 MG PO TABS: 7.5000 mg | ORAL_TABLET | Freq: Every day | ORAL | 0 refills | Status: AC

## 2021-07-25 MED ORDER — DEXAMETHASONE SODIUM PHOSPHATE 10 MG/ML IJ SOLN
10.0000 mg | Freq: Once | INTRAMUSCULAR | Status: AC
  Administered 2021-07-25: 10 mg via INTRAMUSCULAR
  Filled 2021-07-25: qty 1

## 2021-07-25 MED ORDER — KETOROLAC TROMETHAMINE 15 MG/ML IJ SOLN
15.0000 mg | Freq: Once | INTRAMUSCULAR | Status: AC
  Administered 2021-07-25: 15 mg via INTRAMUSCULAR
  Filled 2021-07-25: qty 1

## 2021-07-25 MED ORDER — LIDOCAINE 5 % EX PTCH: 1.0000 | MEDICATED_PATCH | CUTANEOUS | 0 refills | Status: AC

## 2021-07-25 NOTE — ED Triage Notes (Signed)
Pt arrives to ED with c/o lower back pain. This problem is chronic, hx DDD L4/L5. Pt reports that this pain acutely worsened this morning while taking his jacket off. He denies lifting, bending, or twisting prior to pain. The pain is sharp. Pt reports slight numbness in buttocks. No numbness/tingling in legs.

## 2021-07-25 NOTE — ED Notes (Signed)
Pt ambulated per [place], [place]. Pt had a steady gait with limited assistance.

## 2021-07-25 NOTE — ED Provider Notes (Signed)
[facility] EMERGENCY DEPT Provider Note   CSN: [id number] Arrival date & time: 07/25/21  1050     History Chief Complaint  Patient presents with   Back Pain    Jay Keller is a 45 y.o. male and cervical spine presenting today with complaint of acute back pain.  Patient reports he was taking off his jacket when all of a sudden he had a sharp pain in his lower back that radiated down to his legs.  Has been seeing a chiropractor and spine specialist for his DDD.  All of this is in [place].  Reports being 2 years sober from alcohol and drugs.  Is scheduled for another round of steroid injections on 12/6.  Denies any bowel/bladder dysfunction, fevers, chills or IVDU.  Rates his pain 10 out of 10.  He uses Tylenol and meloxicam chronically for this pain.  Last Tylenol dose this morning, last ibuprofen or meloxicam yesterday.  Reports he works in [employer]" does not listen to any doctors about limitations."   Past Medical History:  Diagnosis Date   Chronic back pain    DDD (degenerative disc disease), lumbar    Hepatitis C    dx'ed in 2017   Sciatica     Patient Active Problem List   Diagnosis Date Noted   GAD (generalized anxiety disorder) 12/12/2015   Alcohol use disorder, severe, dependence (HCC) 12/11/2015   Chronic hepatitis C without hepatic coma (HCC) 2017   Ankle fracture, lateral malleolus, closed 03/12/2011   Ankle fracture 01/16/2011    History reviewed. No pertinent surgical history.     History reviewed. No pertinent family history.  Social History   Tobacco Use   Smoking status: Every Day    Packs/day: 1.00    Years: 25.00    Pack years: 25.00    Types: Cigarettes   Smokeless tobacco: Never  Substance Use Topics   Alcohol use: Yes    Comment: rarely, DWI x 5   Drug use: Not Currently    Types: Marijuana, Methamphetamines, Cocaine    Home Medications Prior to Admission medications   Medication Sig Start Date  End Date Taking? Authorizing Provider  meloxicam (MOBIC) 15 MG tablet Take 15 mg by mouth daily. 07/10/21  Yes [provider]  tiZANidine (ZANAFLEX) 4 MG tablet Take 4 mg by mouth in the morning, at noon, and at bedtime. 12/07/20  Yes [provider]  Ledipasvir-Sofosbuvir (HARVONI) 90-400 MG TABS Take 1 tablet by mouth daily. Patient not taking: Reported on 05/19/2019 02/23/19   Powers, Arley Phenix, MD    Allergies    Patient has no known allergies.  Review of Systems   Review of Systems  Constitutional:  Negative for chills and fever.  Genitourinary:  Negative for difficulty urinating.  Musculoskeletal:  Positive for back pain and gait problem. Negative for myalgias.  Neurological:  Negative for numbness.  All other systems reviewed and are negative.  Physical Exam Updated Vital Signs BP 123/88   Pulse 97   Temp 98.5 F (36.9 C)   Resp 20   Ht 5\' 2"  (1.575 m)   Wt 70.3 kg   SpO2 100%   BMI 28.35 kg/m   Physical Exam Vitals and nursing note reviewed.  Constitutional:      Appearance: Normal appearance.  HENT:     Head: Normocephalic and atraumatic.  Eyes:     General: No scleral icterus.    Conjunctiva/sclera: Conjunctivae normal.  Pulmonary:     Effort:  Pulmonary effort is normal. No respiratory distress.  Musculoskeletal:        General: Tenderness (Midline tenderness of lumbar spine.  No obvious injury or deformity.  No step-offs) present. No swelling. Normal range of motion.  Skin:    General: Skin is warm and dry.     Findings: No rash.  Neurological:     Mental Status: He is alert.  Psychiatric:        Mood and Affect: Mood normal.    ED Results / Procedures / Treatments   Labs (all labs ordered are listed, but only abnormal results are displayed) Labs Reviewed - No data to display  EKG None  Radiology DG Lumbar Spine Complete  Result Date: 07/25/2021 CLINICAL DATA:  Low back pain EXAM: LUMBAR SPINE - COMPLETE 4+ VIEW COMPARISON:   04/10/2016 FINDINGS: Mild scoliotic curvature convex to the right. Disc space narrowing at L4-5 and L5-S1, as seen previously. Mild lower lumbar facet osteoarthritis as seen previously. IMPRESSION: Mild scoliotic curvature. Mild lower lumbar degenerative disc disease and degenerative facet disease, similar to the previous study. Findings could relate to pain. Electronically Signed   By: Paulina Fusi M.D.   On: 07/25/2021 13:09    Procedures Procedures   Medications Ordered in ED Medications  dexamethasone (DECADRON) injection 10 mg (has no administration in time range)  ketorolac (TORADOL) 15 MG/ML injection 15 mg (15 mg Intramuscular Given 07/25/21 1228)    ED Course  I have reviewed the triage vital signs and the nursing notes.  Pertinent labs & imaging results that were available during my care of the patient were reviewed by me and considered in my medical decision making (see chart for details).    MDM Rules/Calculators/A&P  Patient fully evaluated by me.  Motion limited by pain.  Patient reluctant to set up or participate in neurologic exam.  Reportedly walked to the car to come to the hospital.  Has not walked since has been in the hospital.  EMT attempted to ambulate the patient and he was able to walk and move all levels of the spine. X-ray similar to scan in 2017.  He is from [place] and has run out of his meloxicam.  I will refill this as well as start him on muscle relaxants that he has not used in 5 years.  I have also sent lidocaine patches for him to use.  He understands the instructions for proper use.  Cauda equina symptoms discussed and patient will return with any worsening of his condition.  Work note supplied for the next week.  He and his significant other are requesting a steroid shot at this time.  He does not have diabetes, I am not concerned about problems with blood sugar control.  He will get this shot and stable for discharge.  Final Clinical Impression(s) /  ED Diagnoses Final diagnoses:  Acute midline low back pain without sciatica    Rx / DC Orders Results and diagnoses were explained to the patient. Return precautions discussed in full. Patient had no additional questions and expressed complete understanding.     Woodroe Chen 07/25/21 1353    Benjiman Core, MD 07/26/21 [phone]

## 2021-07-25 NOTE — ED Notes (Signed)
Patient verbalizes understanding of discharge instructions. Opportunity for questioning and answers were provided. Patient discharged from ED.  °

## 2021-07-25 NOTE — Discharge Instructions (Addendum)
It appears you are having an acute flaring of your chronic back pain.  Please keep your appointments for both December and January.  You have been given a Toradol as well as a steroid shot today.  I have sent muscle relaxants, a refill of your meloxicam and lidocaine patches to your pharmacy.  Read information about back pain attached to these discharge papers.

## 2022-04-13 ENCOUNTER — Other Ambulatory Visit: Payer: Self-pay

## 2022-04-13 ENCOUNTER — Encounter (HOSPITAL_BASED_OUTPATIENT_CLINIC_OR_DEPARTMENT_OTHER): Payer: Self-pay

## 2022-04-13 ENCOUNTER — Emergency Department (HOSPITAL_BASED_OUTPATIENT_CLINIC_OR_DEPARTMENT_OTHER)
Admission: EM | Admit: 2022-04-13 | Discharge: 2022-04-13 | Disposition: A | Discharge status: Home / Self Care | Payer: BLUE CROSS/BLUE SHIELD | Attending: Emergency Medicine | Admitting: Emergency Medicine

## 2022-04-13 DIAGNOSIS — S30861A Insect bite (nonvenomous) of abdominal wall, initial encounter: Secondary | ICD-10-CM | POA: Diagnosis not present

## 2022-04-13 DIAGNOSIS — S70361A Insect bite (nonvenomous), right thigh, initial encounter: Secondary | ICD-10-CM | POA: Insufficient documentation

## 2022-04-13 DIAGNOSIS — S70362A Insect bite (nonvenomous), left thigh, initial encounter: Secondary | ICD-10-CM | POA: Insufficient documentation

## 2022-04-13 DIAGNOSIS — W57XXXA Bitten or stung by nonvenomous insect and other nonvenomous arthropods, initial encounter: Secondary | ICD-10-CM | POA: Diagnosis not present

## 2022-04-13 MED ORDER — HYDROXYZINE HCL 25 MG PO TABS: 25.0000 mg | ORAL_TABLET | Freq: Four times a day (QID) | ORAL | 0 refills | Status: AC

## 2022-04-13 MED ORDER — PERMETHRIN 5 % EX CREA: TOPICAL_CREAM | CUTANEOUS | 0 refills | Status: AC

## 2022-04-13 MED ORDER — HYDROCORTISONE 1 % EX CREA: TOPICAL_CREAM | CUTANEOUS | 0 refills | Status: AC

## 2022-04-13 NOTE — ED Notes (Signed)
Discharge paperwork given and verbally understood. 

## 2022-04-13 NOTE — ED Provider Notes (Signed)
[facility] EMERGENCY DEPT Provider Note   CSN: [id number] Arrival date & time: 04/13/22  1722     History  Chief Complaint  Patient presents with   Insect Bite   Rash    Jay Keller is a 46 y.o. male.   Rash    46 year old male presenting to the emerged part with complaint of multiple insect bites.  The patient states that he has been working in the woods for the past 3 weeks and is been experiencing bites to his bilateral thighs and groin.  He is concerned for chigger bites.  He has been trying over-the-counter steroid cream without success.  He endorses persistent itching, worse at night.  He denies any history of scabies infestation.  No fevers or chills.  Home Medications Prior to Admission medications   Medication Sig Start Date End Date Taking? Authorizing Provider  hydrocortisone cream 1 % Apply to affected area 2 times daily 04/13/22  Yes Ernie Avena, MD  hydrOXYzine (ATARAX) 25 MG tablet Take 1 tablet (25 mg total) by mouth every 6 (six) hours. 04/13/22  Yes Ernie Avena, MD  permethrin (ELIMITE) 5 % cream Apply to affected area once 04/13/22  Yes Ernie Avena, MD  Ledipasvir-Sofosbuvir (HARVONI) 90-400 MG TABS Take 1 tablet by mouth daily. Patient not taking: Reported on 05/19/2019 02/23/19   Powers, Arley Phenix, MD  lidocaine (LIDODERM) 5 % Place 1 patch onto the skin daily. Remove & Discard patch within 12 hours or as directed by MD 07/25/21   Redwine, Madison A, PA-C  meloxicam (MOBIC) 15 MG tablet Take 15 mg by mouth daily. 07/10/21   [provider]  methocarbamol (ROBAXIN) 500 MG tablet Take 1 tablet (500 mg total) by mouth 2 (two) times daily. 07/25/21   Redwine, Madison A, PA-C  tiZANidine (ZANAFLEX) 4 MG tablet Take 4 mg by mouth in the morning, at noon, and at bedtime. 12/07/20   [provider]      Allergies    Patient has no known allergies.    Review of Systems   Review of Systems  Skin:  Positive for rash.  All other  systems reviewed and are negative.   Physical Exam Updated Vital Signs BP (!) 119/91 (BP Location: Right Arm)   Pulse 84   Temp 98.4 F (36.9 C)   Resp 12   Ht 5\' 3"  (1.6 m)   Wt 65.8 kg   SpO2 98%   BMI 25.69 kg/m  Physical Exam Vitals and nursing note reviewed.  Constitutional:      General: He is not in acute distress. HENT:     Head: Normocephalic and atraumatic.  Eyes:     Conjunctiva/sclera: Conjunctivae normal.     Pupils: Pupils are equal, round, and reactive to light.  Cardiovascular:     Rate and Rhythm: Normal rate and regular rhythm.  Pulmonary:     Effort: Pulmonary effort is normal. No respiratory distress.  Abdominal:     General: There is no distension.     Tenderness: There is no guarding.  Musculoskeletal:        General: No deformity or signs of injury.     Cervical back: Neck supple.  Skin:    Findings: No lesion or rash.     Comments: Multiple bite marks noted to the bilateral groin and no specific distribution, excoriations present, no surrounding cellulitis  Neurological:     General: No focal deficit present.     Mental Status: He is alert. Mental  status is at baseline.     ED Results / Procedures / Treatments   Labs (all labs ordered are listed, but only abnormal results are displayed) Labs Reviewed - No data to display  EKG None  Radiology No results found.  Procedures Procedures    Medications Ordered in ED Medications - No data to display  ED Course/ Medical Decision Making/ A&P                           Medical Decision Making Risk Prescription drug management.     46 year old male presenting to the emerged part with complaint of multiple insect bites.  The patient states that he has been working in the woods for the past 3 weeks and is been experiencing bites to his bilateral thighs and groin.  He is concerned for chigger bites.  He has been trying over-the-counter steroid cream without success.  He endorses  persistent itching, worse at night.  He denies any history of scabies infestation.  No fevers or chills.  On arrival, the patient was vitally stable.  Differential includes insect bite from an outdoor insect such as chiggers versus scabies.  We will treat for both with hydrocortisone, Benadryl, permethrin cream.  Stable for discharge.   Final Clinical Impression(s) / ED Diagnoses Final diagnoses:  Insect bite of thigh, unspecified laterality, initial encounter    Rx / DC Orders ED Discharge Orders          Ordered    hydrocortisone cream 1 %        04/13/22 2000    permethrin (ELIMITE) 5 % cream        04/13/22 2000    hydrOXYzine (ATARAX) 25 MG tablet  Every 6 hours        04/13/22 2000              Ernie Avena, MD 04/13/22 2002

## 2022-04-13 NOTE — ED Triage Notes (Signed)
Pt states that he has been in the woods a lot for the past 3 weeks and now reports rash/bug bites on BIL thighs, groin, and now lower back.

## 2022-04-13 NOTE — Discharge Instructions (Addendum)
Recommend you continue to take over-the-counter hydrocortisone cream (a prescription has been prescribed), will trial a treatment course of permethrin cream for scabies, recommend antihistamines as well for itching.  Your symptoms could be due to chigger bites, recommend avoiding areas that could be infested, utilizing bug spray to skin and clothing to help prevent bites
